# Patient Record
Sex: Female | Born: 1985
Health system: Southern US, Community
[De-identification: ages and names within clinical notes are randomized; demographics above are authoritative.]

## PROBLEM LIST (undated history)

## (undated) ENCOUNTER — Inpatient Hospital Stay (HOSPITAL_COMMUNITY): Payer: Self-pay

## (undated) DIAGNOSIS — G43909 Migraine, unspecified, not intractable, without status migrainosus: Secondary | ICD-10-CM

## (undated) DIAGNOSIS — F329 Major depressive disorder, single episode, unspecified: Secondary | ICD-10-CM

## (undated) DIAGNOSIS — J45909 Unspecified asthma, uncomplicated: Secondary | ICD-10-CM

## (undated) DIAGNOSIS — F1110 Opioid abuse, uncomplicated: Secondary | ICD-10-CM

## (undated) DIAGNOSIS — F319 Bipolar disorder, unspecified: Secondary | ICD-10-CM

## (undated) DIAGNOSIS — B009 Herpesviral infection, unspecified: Secondary | ICD-10-CM

## (undated) DIAGNOSIS — Z302 Encounter for sterilization: Secondary | ICD-10-CM

## (undated) DIAGNOSIS — N12 Tubulo-interstitial nephritis, not specified as acute or chronic: Secondary | ICD-10-CM

## (undated) DIAGNOSIS — F1910 Other psychoactive substance abuse, uncomplicated: Secondary | ICD-10-CM

## (undated) DIAGNOSIS — N179 Acute kidney failure, unspecified: Secondary | ICD-10-CM

## (undated) DIAGNOSIS — F419 Anxiety disorder, unspecified: Secondary | ICD-10-CM

## (undated) DIAGNOSIS — R9431 Abnormal electrocardiogram [ECG] [EKG]: Secondary | ICD-10-CM

## (undated) DIAGNOSIS — F32A Depression, unspecified: Secondary | ICD-10-CM

## (undated) DIAGNOSIS — K219 Gastro-esophageal reflux disease without esophagitis: Secondary | ICD-10-CM

---
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
## 2004-01-09 ENCOUNTER — Emergency Department (HOSPITAL_COMMUNITY): Admission: EM | Admit: 2004-01-09 | Discharge: 2004-01-09 | Payer: Self-pay | Admitting: Emergency Medicine

## 2004-01-13 ENCOUNTER — Emergency Department (HOSPITAL_COMMUNITY): Admission: EM | Admit: 2004-01-13 | Discharge: 2004-01-13 | Payer: Self-pay | Admitting: Family Medicine

## 2004-05-26 ENCOUNTER — Emergency Department (HOSPITAL_COMMUNITY): Admission: EM | Admit: 2004-05-26 | Discharge: 2004-05-26 | Payer: Self-pay | Admitting: *Deleted

## 2005-07-18 ENCOUNTER — Emergency Department (HOSPITAL_COMMUNITY): Admission: EM | Admit: 2005-07-18 | Discharge: 2005-07-18 | Payer: Self-pay | Admitting: Family Medicine

## 2006-07-19 ENCOUNTER — Inpatient Hospital Stay (HOSPITAL_COMMUNITY): Admission: AD | Admit: 2006-07-19 | Discharge: 2006-07-19 | Payer: Self-pay | Admitting: Obstetrics and Gynecology

## 2006-07-20 ENCOUNTER — Inpatient Hospital Stay (HOSPITAL_COMMUNITY): Admission: AD | Admit: 2006-07-20 | Discharge: 2006-07-20 | Payer: Self-pay | Admitting: Obstetrics and Gynecology

## 2006-08-19 ENCOUNTER — Inpatient Hospital Stay (HOSPITAL_COMMUNITY): Admission: AD | Admit: 2006-08-19 | Discharge: 2006-08-30 | Payer: Self-pay | Admitting: Obstetrics and Gynecology

## 2006-08-28 ENCOUNTER — Encounter (INDEPENDENT_AMBULATORY_CARE_PROVIDER_SITE_OTHER): Payer: Self-pay | Admitting: Obstetrics and Gynecology

## 2007-01-04 DIAGNOSIS — B009 Herpesviral infection, unspecified: Secondary | ICD-10-CM

## 2007-01-04 HISTORY — DX: Herpesviral infection, unspecified: B00.9

## 2007-01-14 ENCOUNTER — Emergency Department (HOSPITAL_COMMUNITY): Admission: EM | Admit: 2007-01-14 | Discharge: 2007-01-14 | Payer: Self-pay | Admitting: Family Medicine

## 2007-10-23 ENCOUNTER — Emergency Department (HOSPITAL_COMMUNITY): Admission: EM | Admit: 2007-10-23 | Discharge: 2007-10-23 | Payer: Self-pay | Admitting: Emergency Medicine

## 2009-01-06 ENCOUNTER — Emergency Department (HOSPITAL_COMMUNITY): Admission: EM | Admit: 2009-01-06 | Discharge: 2009-01-06 | Payer: Self-pay | Admitting: Emergency Medicine

## 2009-04-01 ENCOUNTER — Emergency Department (HOSPITAL_COMMUNITY): Admission: EM | Admit: 2009-04-01 | Discharge: 2009-04-01 | Payer: Self-pay | Admitting: Family Medicine

## 2009-04-04 ENCOUNTER — Emergency Department (HOSPITAL_COMMUNITY): Admission: EM | Admit: 2009-04-04 | Discharge: 2009-04-04 | Payer: Self-pay | Admitting: Emergency Medicine

## 2010-05-21 NOTE — Discharge Summary (Signed)
NAME:  Eileen Torres, Eileen Torres NO.:  [id number]   MEDICAL RECORD NO.:  [id number]          PATIENT TYPE:  INP   LOCATION:  9107                          FACILITY:  [facility]   PHYSICIAN:  Huel Cote, M.D. DATE OF BIRTH:  25-Jul-1985   DATE OF ADMISSION:  08/19/2006  DATE OF DISCHARGE:  08/30/2006                               DISCHARGE SUMMARY   DISCHARGE DIAGNOSES:  1. Preterm pregnancy, delivered at 32 plus weeks gestation.  2. Status post preterm premature rupture of membranes at [redacted] weeks      gestation.   DISCHARGE MEDICATIONS:  1. Motrin 600 mg p.o. every 6 hours p.r.n.  2. Percocet 1-2 tablets p.o. every 4 hours p.r.n.   DISCHARGE FOLLOWUP:  The patient will follow up in the office in 6 weeks  for her full postpartum exam   HOSPITAL COURSE:  The patient and a 25 year old G3, P 0-0-2-0, who was  admitted at 13 plus weeks gestation with spontaneous rupture of  membranes noted and confirmed on admission. Her prenatal course had been  complicated by shortening and funneling of her cervix at [redacted] weeks  gestation for which she was started on 17 hydroxyprogesterone and had  received betamethasone.   PAST OBSTETRICAL HISTORY:  Prior to this pregnancy was significant for  two elective abortions.   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 Remote history of asthma.  2. Depression.  3. History of migraines.   She has no significant surgical history.   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 Blood type is  O+. Antibody screen negative, rubella immune, hepatitis B surface  antigen negative, syphilis negative, HIV negative, GC and chlamydia  negative.   On admission, the patient was noted be grossly ruptured.  Cervix was 1  cm dilated just prior to that. She was noted to be vertex by ultrasound  on admission. She received her  betamethasone as stated at 27 weeks, and  when she was admitted she was placed on ampicillin and erythromycin as  well as Valtrex. She then remained in-house under close observation and  had an uneventful course for approximately 2 weeks.   On August 25, the patient awoke with significant contractions and some  bloody discharge noted.  Her cervix had changed to approximately 3 cm  and completely effaced consistent with labor. Therefore, she was moved  to labor and delivery.  Fetal heart rate reactive, and she had no  temperature at that point. There were no signs and symptoms of active  chorio; however, given her preterm status, she was placed on ampicillin,  and her prolonged rupture of membranes.  She progressed on her own to  approximately 5 cm and had a rupture of a forebag at that point. Fetal  heart rate remained reassuring but did increase in baseline. Her white  blood cell count was noted to be 21,000, and she still had no  temperature. She progressed to complete dilation and pushed well  with  vaginal delivery of a vigorous female infant over an intact perineum.  Apgars were 7/8.  Weight of the baby was 4 pounds 5 ounces. There was a  nuchal cord x1 which was delivered through. The placenta delivered  spontaneously.  However, there were some remaining placental fragments  palpated in the uterus fundus and had to be removed manually.  Cervix  and rectum and vagina were intact.  Estimated blood loss was 400 mL.  The baby was taken to the NICU for early gestational age; however,  looked good and was requiring only blow-by nasal cannula O2.   By postpartum day #2, the patient was doing quite well.  She remained on  antibiotics for a short while post delivery and had no temperature  spike. She was felt stable for discharge home on August 30, 2006, and  had social work and other support mechanisms in place for her baby in  the NICU.  Therefore, she was discharged home this a Motrin  prescription  and actually declined Percocet prescription.      Huel Cote, M.D.  Electronically Signed     KR/MEDQ  D:  09/30/2006  T:  09/30/2006  Job:  [id number]

## 2010-09-03 ENCOUNTER — Inpatient Hospital Stay (INDEPENDENT_AMBULATORY_CARE_PROVIDER_SITE_OTHER)
Admission: RE | Admit: 2010-09-03 | Discharge: 2010-09-03 | Disposition: A | Discharge status: Home / Self Care | Payer: Self-pay | Source: Ambulatory Visit | Attending: Family Medicine | Admitting: Family Medicine

## 2010-09-03 DIAGNOSIS — G43009 Migraine without aura, not intractable, without status migrainosus: Secondary | ICD-10-CM

## 2010-10-08 ENCOUNTER — Emergency Department (HOSPITAL_COMMUNITY)
Admission: EM | Admit: 2010-10-08 | Discharge: 2010-10-09 | Discharge status: Against Medical Advice | Payer: Self-pay | Attending: Emergency Medicine | Admitting: Emergency Medicine

## 2010-10-08 DIAGNOSIS — Z0389 Encounter for observation for other suspected diseases and conditions ruled out: Secondary | ICD-10-CM | POA: Insufficient documentation

## 2010-10-09 ENCOUNTER — Emergency Department (HOSPITAL_COMMUNITY)
Admission: EM | Admit: 2010-10-09 | Discharge: 2010-10-09 | Disposition: A | Discharge status: Home / Self Care | Payer: Self-pay | Attending: Emergency Medicine | Admitting: Emergency Medicine

## 2010-10-09 DIAGNOSIS — Z8619 Personal history of other infectious and parasitic diseases: Secondary | ICD-10-CM | POA: Insufficient documentation

## 2010-10-09 DIAGNOSIS — K5289 Other specified noninfective gastroenteritis and colitis: Secondary | ICD-10-CM | POA: Insufficient documentation

## 2010-10-09 LAB — URINALYSIS, ROUTINE W REFLEX MICROSCOPIC
Nitrite: NEGATIVE
Specific Gravity, Urine: 1.028 (ref 1.005–1.030)
Urobilinogen, UA: 0.2 mg/dL (ref 0.0–1.0)

## 2010-10-09 LAB — DIFFERENTIAL
Eosinophils Absolute: 0.1 10*3/uL (ref 0.0–0.7)
Lymphocytes Relative: 20 % (ref 12–46)
Monocytes Absolute: 1.4 10*3/uL — ABNORMAL HIGH (ref 0.1–1.0)
Monocytes Relative: 11 % (ref 3–12)
Neutro Abs: 8.5 10*3/uL — ABNORMAL HIGH (ref 1.7–7.7)

## 2010-10-09 LAB — CBC
HCT: 45.4 % (ref 36.0–46.0)
Hemoglobin: 16 g/dL — ABNORMAL HIGH (ref 12.0–15.0)
Platelets: 342 10*3/uL (ref 150–400)
WBC: 12.4 10*3/uL — ABNORMAL HIGH (ref 4.0–10.5)

## 2010-10-09 LAB — COMPREHENSIVE METABOLIC PANEL
Albumin: 5.1 g/dL (ref 3.5–5.2)
Alkaline Phosphatase: 86 U/L (ref 39–117)
Calcium: 11 mg/dL — ABNORMAL HIGH (ref 8.4–10.5)
Chloride: 94 mEq/L — ABNORMAL LOW (ref 96–112)
Potassium: 2.9 mEq/L — ABNORMAL LOW (ref 3.5–5.1)
Sodium: 138 mEq/L (ref 135–145)
Total Bilirubin: 0.8 mg/dL (ref 0.3–1.2)
Total Protein: 9.1 g/dL — ABNORMAL HIGH (ref 6.0–8.3)

## 2010-10-09 LAB — URINE MICROSCOPIC-ADD ON

## 2010-10-09 LAB — LIPASE, BLOOD: Lipase: 21 U/L (ref 11–59)

## 2010-10-11 LAB — URINE CULTURE: Culture  Setup Time: 201210061820

## 2010-10-15 LAB — CBC
HCT: 28.1 — ABNORMAL LOW
HCT: 32 — ABNORMAL LOW
HCT: 32.5 — ABNORMAL LOW
Hemoglobin: 11.2 — ABNORMAL LOW
MCV: 88.8
MCV: 89.3
Platelets: 221
RBC: 3.59 — ABNORMAL LOW
RDW: 13.4
RDW: 13.8
WBC: 15 — ABNORMAL HIGH
WBC: 21.2 — ABNORMAL HIGH

## 2010-10-15 LAB — STREP B DNA PROBE

## 2010-12-14 ENCOUNTER — Emergency Department (HOSPITAL_COMMUNITY)
Admission: EM | Admit: 2010-12-14 | Discharge: 2010-12-15 | Disposition: A | Discharge status: Home / Self Care | Payer: Medicaid Other | Attending: Emergency Medicine | Admitting: Emergency Medicine

## 2010-12-14 ENCOUNTER — Encounter: Payer: Self-pay | Admitting: Emergency Medicine

## 2010-12-14 DIAGNOSIS — O209 Hemorrhage in early pregnancy, unspecified: Secondary | ICD-10-CM | POA: Insufficient documentation

## 2010-12-14 DIAGNOSIS — O200 Threatened abortion: Secondary | ICD-10-CM

## 2010-12-14 LAB — URINALYSIS, ROUTINE W REFLEX MICROSCOPIC
Bilirubin Urine: NEGATIVE
Glucose, UA: NEGATIVE mg/dL
Ketones, ur: NEGATIVE mg/dL
Leukocytes, UA: NEGATIVE
Nitrite: NEGATIVE
Protein, ur: NEGATIVE mg/dL
Specific Gravity, Urine: 1.008 (ref 1.005–1.030)
Urobilinogen, UA: 0.2 mg/dL (ref 0.0–1.0)
pH: 6 (ref 5.0–8.0)

## 2010-12-14 LAB — URINE MICROSCOPIC-ADD ON

## 2010-12-14 LAB — POCT I-STAT, CHEM 8
Calcium, Ion: 1.19 mmol/L (ref 1.12–1.32)
Chloride: 105 mEq/L (ref 96–112)
HCT: 35 % — ABNORMAL LOW (ref 36.0–46.0)
Hemoglobin: 11.9 g/dL — ABNORMAL LOW (ref 12.0–15.0)
TCO2: 22 mmol/L (ref 0–100)

## 2010-12-14 MED ORDER — KETOROLAC TROMETHAMINE 30 MG/ML IJ SOLN
30.0000 mg | Freq: Once | INTRAMUSCULAR | Status: AC
  Administered 2010-12-14: 30 mg via INTRAVENOUS
  Filled 2010-12-14: qty 1

## 2010-12-14 NOTE — ED Notes (Signed)
MD at bedside. 

## 2010-12-14 NOTE — ED Notes (Signed)
Pt states she is pregnant and started having cramping last night and today she states she started having some bleeding and has passed a large clot earlier this evening  Pt states now is having bright red blood and is having cramping and pain in her pelvic region  Pt states the flow of blood varies in amt

## 2010-12-14 NOTE — ED Notes (Signed)
Pt states she has had some light headedness associated with the pain  Pt states she sees Dr Senaida Ores  Pt states she is to see him when she is [redacted] weeks pregnant but she is only 11  Pt states she had difficulty with her pregnancy of her daughter

## 2010-12-15 ENCOUNTER — Emergency Department (HOSPITAL_COMMUNITY): Payer: Medicaid Other

## 2010-12-15 LAB — GC/CHLAMYDIA PROBE AMP, GENITAL
Chlamydia, DNA Probe: NEGATIVE
GC Probe Amp, Genital: NEGATIVE

## 2010-12-15 LAB — WET PREP, GENITAL
Trich, Wet Prep: NONE SEEN
Yeast Wet Prep HPF POC: NONE SEEN

## 2010-12-15 LAB — ABO/RH: ABO/RH(D): O POS

## 2010-12-15 LAB — HCG, QUANTITATIVE, PREGNANCY: hCG, Beta Chain, Quant, S: 67077 m[IU]/mL — ABNORMAL HIGH (ref <5)

## 2010-12-15 NOTE — ED Notes (Signed)
Patient returned from Ultrasound. 

## 2010-12-15 NOTE — ED Notes (Signed)
Patient transported to Ultrasound 

## 2010-12-15 NOTE — ED Provider Notes (Signed)
History     CSN: [id number] Arrival date & time: 12/14/2010  8:19 PM   First MD Initiated Contact with Patient 12/14/10 2027      Chief Complaint  Patient presents with  . Miscarriage     HPI: [facility]. Has had 3 positive HPT since. Scheduled for first OB visit in approx 1 week. States is approx 11 wks preg. States onset of mild lower abd  cramping yesterday. Started spotting earlier today and by 1800 this evening  was bleeding like a period w/ small clots.    History reviewed. No pertinent past medical history.  History reviewed. No pertinent past surgical history.  Family History  Problem Relation Age of Onset  . Cancer Other   . Diabetes Other   . Heart failure Other   . Stroke Other     History  Substance Use Topics  . Smoking status: Current Everyday Smoker  . Smokeless tobacco: Not on file  . Alcohol Use: No    OB History    Grav Para Term Preterm Abortions TAB SAB Ect Mult Living   2 1              Review of Systems  Constitutional: Negative.   HENT: Negative.   Eyes: Negative.   Respiratory: Negative.   Cardiovascular: Negative.   Gastrointestinal: Negative.   Genitourinary: Negative.   Musculoskeletal: Negative.   Skin: Negative.   Neurological: Negative.   Hematological: Negative.   Psychiatric/Behavioral: Negative.     Allergies  Review of patient's allergies indicates no known allergies.  Home Medications   Current Outpatient Rx  Name Route Sig Dispense Refill  . IBUPROFEN 200 MG PO TABS Oral Take 200 mg by mouth every 6 (six) hours as needed. Pain on pelvic, stomach back areas     . KP PRENATAL MULTIVITAMINS PO Oral Take 1 tablet by mouth daily.        BP 99/50  Pulse 92  Temp(Src) 97.5 F (36.4 C) (Oral)  Resp 18  SpO2 96%  LMP 10/17/2010  Physical Exam  Constitutional: She is oriented to person, place, and time. She appears well-developed and well-nourished.    HENT:  Head: Normocephalic and atraumatic.  Eyes: Conjunctivae are normal.  Neck: Neck supple.  Cardiovascular: Normal rate and regular rhythm.   Pulmonary/Chest: Effort normal and breath sounds normal.  Abdominal: Soft. Bowel sounds are normal.  Genitourinary: Vagina normal and uterus normal. Pelvic exam was performed with patient supine. There is no rash or tenderness on the right labia. There is no rash or tenderness on the left labia. Cervix exhibits no motion tenderness and no friability. Right adnexum displays no mass, no tenderness and no fullness. Left adnexum displays no mass, no tenderness and no fullness.  Musculoskeletal: Normal range of motion.  Neurological: She is alert and oriented to person, place, and time.  Skin: Skin is warm and dry. No erythema.  Psychiatric: She has a normal mood and affect.    ED Course  Pelvic exam Date/Time: 12/15/2010 1:15 AM Performed by: Leanne Chang Authorized by: Leanne Chang Consent: Verbal consent obtained. Risks and benefits: risks, benefits and alternatives were discussed Consent given by: patient Patient understanding: patient states understanding of the procedure being performed Required items: required blood products, implants, devices, and special equipment available Patient identity confirmed: verbally with patient and arm band Local anesthesia used: no Patient sedated: no Comments: See PE notes  Findings at this  point discussed w/ pt. Awaiting u/s. 0115: U/S findings discussed w/ pt. Will plan for d/c home w/ instructions on "bleeding during early preg" and" threatened miscarriage" and encoursge pt to call her OB/GYN MD tomorrow arrange f/u than sooner than current scheduled appointment.   Labs Reviewed  URINALYSIS, ROUTINE W REFLEX MICROSCOPIC - Abnormal; Notable for the following:    Hgb urine dipstick SMALL (*)    All other components within normal limits  POCT I-STAT, CHEM 8 - Abnormal; Notable for the  following:    Potassium 3.4 (*)    Hemoglobin 11.9 (*)    HCT 35.0 (*)    All other components within normal limits  URINE MICROSCOPIC-ADD ON - Abnormal; Notable for the following:    Squamous Epithelial / LPF FEW (*)    Bacteria, UA FEW (*)    All other components within normal limits  HCG, QUANTITATIVE, PREGNANCY - Abnormal; Notable for the following:    hCG, Beta Chain, Quant, [place] [address] (*)    All other components within normal limits  I-STAT, CHEM 8  POCT PREGNANCY, URINE  ABO/RH   No results found.   No diagnosis found.    MDM  Cramping and bleeding in early pregnancy. Normal OB ultrasound.        Leanne Chang, NP 12/19/10 1818

## 2010-12-20 NOTE — ED Provider Notes (Signed)
Medical screening examination/treatment/procedure(s) were performed by non-physician practitioner and as supervising physician I was immediately available for consultation/collaboration.  Rasul Decola, MD 12/20/10 0805 

## 2011-01-04 HISTORY — PX: DILITATION & CURRETTAGE/HYSTROSCOPY WITH ESSURE: SHX5573

## 2011-01-21 LAB — OB RESULTS CONSOLE ANTIBODY SCREEN: Antibody Screen: NEGATIVE

## 2011-03-02 ENCOUNTER — Other Ambulatory Visit: Payer: Self-pay

## 2011-03-02 ENCOUNTER — Emergency Department (HOSPITAL_COMMUNITY)
Admission: EM | Admit: 2011-03-02 | Discharge: 2011-03-02 | Disposition: A | Discharge status: Home / Self Care | Payer: Medicaid Other | Attending: Emergency Medicine | Admitting: Emergency Medicine

## 2011-03-02 ENCOUNTER — Encounter (HOSPITAL_COMMUNITY): Payer: Self-pay | Admitting: Emergency Medicine

## 2011-03-02 DIAGNOSIS — G43909 Migraine, unspecified, not intractable, without status migrainosus: Secondary | ICD-10-CM | POA: Insufficient documentation

## 2011-03-02 DIAGNOSIS — R209 Unspecified disturbances of skin sensation: Secondary | ICD-10-CM | POA: Insufficient documentation

## 2011-03-02 DIAGNOSIS — O210 Mild hyperemesis gravidarum: Secondary | ICD-10-CM | POA: Insufficient documentation

## 2011-03-02 DIAGNOSIS — F17200 Nicotine dependence, unspecified, uncomplicated: Secondary | ICD-10-CM | POA: Insufficient documentation

## 2011-03-02 DIAGNOSIS — O99891 Other specified diseases and conditions complicating pregnancy: Secondary | ICD-10-CM | POA: Insufficient documentation

## 2011-03-02 HISTORY — DX: Migraine, unspecified, not intractable, without status migrainosus: G43.909

## 2011-03-02 LAB — URINALYSIS, DIPSTICK ONLY
Glucose, UA: NEGATIVE mg/dL
Hgb urine dipstick: NEGATIVE
Ketones, ur: NEGATIVE mg/dL
Leukocytes, UA: NEGATIVE
pH: 7 (ref 5.0–8.0)

## 2011-03-02 MED ORDER — SODIUM CHLORIDE 0.9 % IV BOLUS (SEPSIS)
1000.0000 mL | Freq: Once | INTRAVENOUS | Status: AC
  Administered 2011-03-02: 1000 mL via INTRAVENOUS

## 2011-03-02 MED ORDER — FENTANYL CITRATE 0.05 MG/ML IJ SOLN
50.0000 ug | Freq: Once | INTRAMUSCULAR | Status: AC
  Administered 2011-03-02: 50 ug via INTRAVENOUS
  Filled 2011-03-02: qty 2

## 2011-03-02 MED ORDER — PROMETHAZINE HCL 25 MG/ML IJ SOLN
12.5000 mg | Freq: Four times a day (QID) | INTRAMUSCULAR | Status: DC | PRN
  Administered 2011-03-02: 12.5 mg via INTRAVENOUS
  Filled 2011-03-02: qty 1

## 2011-03-02 NOTE — ED Notes (Signed)
Pt presenting to ed with c/o sent by pcp for left side numbness, blurred vision, headache, nausea and vomiting pt states she was across the street at [facility] office. Pt with numbness to her tongue only at this time. Pt states her headache pain is 10/10. Pt with no neuro deficits noted at this time. Pt with equal grips bilaterally pt with no facial droop noted and no slurred speech at this time

## 2011-03-02 NOTE — ED Notes (Signed)
Pt sent to ED by Dr. Ambrose Mantle. Pt is 21.[redacted] weeks pregnant.  Per MD, pt lost her lateral vision in her left eye this am, then developed headache, nausea, left arm and leg numbness. Pt with h/o migraines.  Neuro assessment WDL in office per Dr. Ambrose Mantle.

## 2011-03-02 NOTE — ED Provider Notes (Signed)
History     CSN: [id number]  Arrival date & time 03/02/11  1427   First MD Initiated Contact with Patient 03/02/11 1552      Chief Complaint  Patient presents with  . Numbness  . Nausea  . Emesis    (Consider location/radiation/quality/duration/timing/severity/associated sxs/prior treatment) HPI Pt presenting to ed with c/o sent by pcp for left side numbness, blurred vision, headache, nausea and vomiting pt states she was across the street at md [facility] office. Pt with numbness to her tongue only at this time.. Pt with no neuro deficits noted at this time. Pt with equal grips bilaterally pt with no facial droop noted and no slurred speech at this time.  Patient has history of migraines in the past.  Strong family history of migraines.  No history of cerebral aneurysms.  Patient states the headache is now 7/10.  Past Medical History  Diagnosis Date  . Migraines     History reviewed. No pertinent past surgical history.  Family History  Problem Relation Age of Onset  . Cancer Other   . Diabetes Other   . Heart failure Other   . Stroke Other     History  Substance Use Topics  . Smoking status: Current Everyday Smoker  . Smokeless tobacco: Not on file  . Alcohol Use: No    OB History    Grav Para Term Preterm Abortions TAB SAB Ect Mult Living   2 1              Review of Systems  All other systems reviewed and are negative.    Allergies  Review of patient's allergies indicates no known allergies.  Home Medications   Current Outpatient Rx  Name Route Sig Dispense Refill  . ACETAMINOPHEN 325 MG PO TABS Oral Take 650 mg by mouth every 6 (six) hours as needed. For pain    . CALCIUM CARBONATE ANTACID 500 MG PO CHEW Oral Chew 1 tablet by mouth as needed.    . IBUPROFEN 200 MG PO TABS Oral Take 400 mg by mouth every 6 (six) hours as needed. Pain on pelvic, stomach back areas    . KP PRENATAL MULTIVITAMINS PO Oral Take 1 tablet by mouth daily.        BP 115/61   Pulse 100  Temp(Src) 98.7 F (37.1 C) (Oral)  Resp 20  SpO2 100%  LMP 10/17/2010  Physical Exam  Nursing note and vitals reviewed. Constitutional: She is oriented to person, place, and time. She appears well-developed and well-nourished. No distress.  HENT:  Head: Normocephalic and atraumatic.  Eyes: Pupils are equal, round, and reactive to light.  Neck: Normal range of motion.  Cardiovascular: Normal rate and intact distal pulses.   Pulmonary/Chest: No respiratory distress.  Abdominal: Normal appearance. She exhibits no distension.  Musculoskeletal: Normal range of motion.  Neurological: She is alert and oriented to person, place, and time. No cranial nerve deficit. GCS eye subscore is 4. GCS verbal subscore is 5. GCS motor subscore is 6.  Skin: Skin is warm and dry. No rash noted.  Psychiatric: She has a normal mood and affect. Her behavior is normal.    ED Course  Procedures (including critical care time) Scheduled Meds:    . fentaNYL  50 mcg Intravenous Once  . sodium chloride  1,000 mL Intravenous Once   Continuous Infusions:  PRN Meds:.promethazine  Labs Reviewed  URINALYSIS, DIPSTICK ONLY - Abnormal; Notable for the following:    Specific Gravity, Urine 1.001 (*)  All other components within normal limits   No results found.   1. Migraine   2. IUP (intrauterine pregnancy), incidental       MDM  After treatment in the ED the patient feels back to baseline and wants to go home.         Nelia Shi, MD 03/02/11 1739

## 2011-03-12 ENCOUNTER — Inpatient Hospital Stay (HOSPITAL_COMMUNITY)
Admission: AD | Admit: 2011-03-12 | Discharge: 2011-03-12 | Disposition: A | Discharge status: Home / Self Care | Payer: Medicaid Other | Source: Ambulatory Visit | Attending: Obstetrics and Gynecology | Admitting: Obstetrics and Gynecology

## 2011-03-12 ENCOUNTER — Encounter (HOSPITAL_COMMUNITY): Payer: Self-pay | Admitting: *Deleted

## 2011-03-12 DIAGNOSIS — R112 Nausea with vomiting, unspecified: Secondary | ICD-10-CM

## 2011-03-12 DIAGNOSIS — O212 Late vomiting of pregnancy: Secondary | ICD-10-CM | POA: Insufficient documentation

## 2011-03-12 DIAGNOSIS — R197 Diarrhea, unspecified: Secondary | ICD-10-CM | POA: Insufficient documentation

## 2011-03-12 HISTORY — DX: Major depressive disorder, single episode, unspecified: F32.9

## 2011-03-12 HISTORY — DX: Anxiety disorder, unspecified: F41.9

## 2011-03-12 HISTORY — DX: Depression, unspecified: F32.A

## 2011-03-12 LAB — DIFFERENTIAL
Eosinophils Relative: 1 % (ref 0–5)
Lymphocytes Relative: 5 % — ABNORMAL LOW (ref 12–46)
Monocytes Absolute: 0.8 10*3/uL (ref 0.1–1.0)
Monocytes Relative: 6 % (ref 3–12)
Neutro Abs: 11.3 10*3/uL — ABNORMAL HIGH (ref 1.7–7.7)

## 2011-03-12 LAB — COMPREHENSIVE METABOLIC PANEL
BUN: 22 mg/dL (ref 6–23)
CO2: 20 mEq/L (ref 19–32)
Chloride: 99 mEq/L (ref 96–112)
Creatinine, Ser: 0.74 mg/dL (ref 0.50–1.10)
GFR calc Af Amer: >90 mL/min (ref >90)
GFR calc non Af Amer: >90 mL/min (ref >90)
Glucose, Bld: 110 mg/dL — ABNORMAL HIGH (ref 70–99)
Total Bilirubin: 0.3 mg/dL (ref 0.3–1.2)

## 2011-03-12 LAB — CBC
HCT: 32.4 % — ABNORMAL LOW (ref 36.0–46.0)
Hemoglobin: 10.9 g/dL — ABNORMAL LOW (ref 12.0–15.0)
MCV: 88.5 fL (ref 78.0–100.0)
WBC: 12.8 10*3/uL — ABNORMAL HIGH (ref 4.0–10.5)

## 2011-03-12 LAB — URINALYSIS, ROUTINE W REFLEX MICROSCOPIC
Leukocytes, UA: NEGATIVE
Nitrite: NEGATIVE
Protein, ur: 30 mg/dL — AB
Urobilinogen, UA: 0.2 mg/dL (ref 0.0–1.0)

## 2011-03-12 LAB — URINE MICROSCOPIC-ADD ON

## 2011-03-12 MED ORDER — PROMETHAZINE HCL 25 MG PO TABS: 12.5000 mg | ORAL_TABLET | Freq: Four times a day (QID) | ORAL | Status: DC | PRN

## 2011-03-12 MED ORDER — ONDANSETRON 8 MG PO TBDP: 8.0000 mg | ORAL_TABLET | Freq: Three times a day (TID) | ORAL | Status: AC | PRN

## 2011-03-12 MED ORDER — FAMOTIDINE 20 MG PO TABS
20.0000 mg | ORAL_TABLET | Freq: Once | ORAL | Status: AC
  Administered 2011-03-12: 20 mg via ORAL
  Filled 2011-03-12: qty 1

## 2011-03-12 MED ORDER — THIAMINE HCL 100 MG/ML IJ SOLN
Freq: Once | INTRAVENOUS | Status: AC
  Administered 2011-03-12: 12:00:00 via INTRAVENOUS
  Filled 2011-03-12: qty 1000

## 2011-03-12 MED ORDER — ONDANSETRON HCL 4 MG/2ML IJ SOLN
4.0000 mg | Freq: Once | INTRAMUSCULAR | Status: AC
  Administered 2011-03-12: 4 mg via INTRAVENOUS
  Filled 2011-03-12: qty 2

## 2011-03-12 MED ORDER — SODIUM CHLORIDE 0.9 % IV BOLUS (SEPSIS)
1000.0000 mL | Freq: Once | INTRAVENOUS | Status: AC
  Administered 2011-03-12: 1000 mL via INTRAVENOUS

## 2011-03-12 NOTE — Discharge Instructions (Signed)
FOLLOW UP IN THE OFFICE IN THE NEXT 2 TO 3 DAYS.  B.R.A.T. Diet Your doctor has recommended the B.R.A.T. diet for you or your child until the condition improves. This is often used to help control diarrhea and vomiting symptoms. If you or your child can tolerate clear liquids, you may have:  Bananas.   Rice.   Applesauce.   Toast (and other simple starches such as crackers, potatoes, noodles).  Be sure to avoid dairy products, meats, and fatty foods until symptoms are better. Fruit juices such as apple, grape, and prune juice can make diarrhea worse. Avoid these. Continue this diet for 2 days or as instructed by your caregiver. Document Released: [id number] Document Revised: 12/09/2010 Document Reviewed: 06/08/2006 [facility] Patient Information 2012 [place], [place].Nausea and Vomiting Nausea is a sick feeling that often comes before throwing up (vomiting). Vomiting is a reflex where stomach contents come out of your mouth. Vomiting can cause severe loss of body fluids (dehydration). Children and elderly adults can become dehydrated quickly, especially if they also have diarrhea. Nausea and vomiting are symptoms of a condition or disease. It is important to find the cause of your symptoms. CAUSES   Direct irritation of the stomach lining. This irritation can result from increased acid production (gastroesophageal reflux disease), infection, food poisoning, taking certain medicines (such as nonsteroidal anti-inflammatory drugs), alcohol use, or tobacco use.   Signals from the brain.These signals could be caused by a headache, heat exposure, an inner ear disturbance, increased pressure in the brain from injury, infection, a tumor, or a concussion, pain, emotional stimulus, or metabolic problems.   An obstruction in the gastrointestinal tract (bowel obstruction).   Illnesses such as diabetes, hepatitis, gallbladder problems, appendicitis, kidney problems, cancer, sepsis, atypical symptoms of a  heart attack, or eating disorders.   Medical treatments such as chemotherapy and radiation.   Receiving medicine that makes you sleep (general anesthetic) during surgery.  DIAGNOSIS Your caregiver may ask for tests to be done if the problems do not improve after a few days. Tests may also be done if symptoms are severe or if the reason for the nausea and vomiting is not clear. Tests may include:  Urine tests.   Blood tests.   Stool tests.   Cultures (to look for evidence of infection).   X-rays or other imaging studies.  Test results can help your caregiver make decisions about treatment or the need for additional tests. TREATMENT You need to stay well hydrated. Drink frequently but in small amounts.You may wish to drink water, sports drinks, clear broth, or eat frozen ice pops or gelatin dessert to help stay hydrated.When you eat, eating slowly may help prevent nausea.There are also some antinausea medicines that may help prevent nausea. HOME CARE INSTRUCTIONS   Take all medicine as directed by your caregiver.   If you do not have an appetite, do not force yourself to eat. However, you must continue to drink fluids.   If you have an appetite, eat a normal diet unless your caregiver tells you differently.   Eat a variety of complex carbohydrates (rice, wheat, potatoes, bread), lean meats, yogurt, fruits, and vegetables.   Avoid high-fat foods because they are more difficult to digest.   Drink enough water and fluids to keep your urine clear or pale yellow.   If you are dehydrated, ask your caregiver for specific rehydration instructions. Signs of dehydration may include:   Severe thirst.   Dry lips and mouth.   Dizziness.  Dark urine.   Decreasing urine frequency and amount.   Confusion.   Rapid breathing or pulse.  SEEK IMMEDIATE MEDICAL CARE IF:   You have blood or brown flecks (like coffee grounds) in your vomit.   You have black or bloody stools.   You  have a severe headache or stiff neck.   You are confused.   You have severe abdominal pain.   You have chest pain or trouble breathing.   You do not urinate at least once every 8 hours.   You develop cold or clammy skin.   You continue to vomit for longer than 24 to 48 hours.   You have a fever.  MAKE SURE YOU:   Understand these instructions.   Will watch your condition.   Will get help right away if you are not doing well or get worse.  Document Released: 12020/04/2904 Document Revised: 12/09/2010 Document Reviewed: 05/19/2010 [facility] Patient Information 2012 [place], [place].Diet for Diarrhea, Adult Having frequent, runny stools (diarrhea) has many causes. Diarrhea may be caused or worsened by food or drink. Diarrhea may be relieved by changing your diet. IF YOU ARE NOT TOLERATING SOLID FOODS:  Drink enough water and fluids to keep your urine clear or pale yellow.   Avoid sugary drinks and sodas as well as milk-based beverages.   Avoid beverages containing caffeine and alcohol.   You may try rehydrating beverages. You can make your own by following this recipe:    tsp table salt.    tsp baking soda.   ? tsp salt substitute (potassium chloride).   1 tbs + 1 tsp sugar.   1 qt water.  As your stools become more solid, you can start eating solid foods. Add foods one at a time. If a certain food causes your diarrhea to get worse, avoid that food and try other foods. A low fiber, low-fat, and lactose-free diet is recommended. Small, frequent meals may be better tolerated.  Starches  Allowed:  White, [place], and pita breads, plain rolls, buns, bagels. Plain muffins, matzo. Soda, saltine, or graham crackers. Pretzels, melba toast, zwieback. Cooked cereals made with water: cornmeal, farina, cream cereals. Dry cereals: refined corn, wheat, rice. Potatoes prepared any way without skins, refined macaroni, spaghetti, noodles, refined rice.   Avoid:  Bread, rolls, or crackers  made with whole wheat, multi-grains, rye, bran seeds, nuts, or coconut. Corn tortillas or taco shells. Cereals containing whole grains, multi-grains, bran, coconut, nuts, or raisins. Cooked or dry oatmeal. Coarse wheat cereals, granola. Cereals advertised as "high-fiber." Potato skins. Whole grain pasta, wild or brown rice. Popcorn. Sweet potatoes/yams. Sweet rolls, doughnuts, waffles, pancakes, sweet breads.  Vegetables  Allowed: Strained tomato and vegetable juices. Most well-cooked and canned vegetables without seeds. Fresh: Tender lettuce, cucumber without the skin, cabbage, spinach, bean sprouts.   Avoid: Fresh, cooked, or canned: Artichokes, baked beans, beet greens, broccoli, Brussels sprouts, corn, kale, legumes, peas, sweet potatoes. Cooked: Green or red cabbage, spinach. Avoid large servings of any vegetables, because vegetables shrink when cooked, and they contain more fiber per serving than fresh vegetables.  Fruit  Allowed: All fruit juices except prune juice. Cooked or canned: Apricots, applesauce, cantaloupe, cherries, fruit cocktail, grapefruit, grapes, kiwi, mandarin oranges, peaches, pears, plums, watermelon. Fresh: Apples without skin, ripe banana, grapes, cantaloupe, cherries, grapefruit, peaches, oranges, plums. Keep servings limited to  cup or 1 piece.   Avoid: Fresh: Apple with skin, apricots, mango, pears, raspberries, strawberries. Prune juice, stewed or dried prunes. Dried fruits, raisins,  dates. Large servings of all fresh fruits.  Meat and Meat Substitutes  Allowed: Ground or well-cooked tender beef, ham, veal, lamb, pork, or poultry. Eggs, plain cheese. Fish, oysters, shrimp, lobster, other seafoods. Liver, organ meats.   Avoid: Tough, fibrous meats with gristle. Peanut butter, smooth or chunky. Cheese, nuts, seeds, legumes, dried peas, beans, lentils.  Milk  Allowed: Yogurt, lactose-free milk, kefir, drinkable yogurt, buttermilk, soy milk.   Avoid: Milk, chocolate  milk, beverages made with milk, such as milk shakes.  Soups  Allowed: Bouillon, broth, or soups made from allowed foods. Any strained soup.   Avoid: Soups made from vegetables that are not allowed, cream or milk-based soups.  Desserts and Sweets  Allowed: Sugar-free gelatin, sugar-free frozen ice pops made without sugar alcohol.   Avoid: Plain cakes and cookies, pie made with allowed fruit, pudding, custard, cream pie. Gelatin, fruit, ice, sherbet, frozen ice pops. Ice cream, ice milk without nuts. Plain hard candy, honey, jelly, molasses, syrup, sugar, chocolate syrup, gumdrops, marshmallows.  Fats and Oils  Allowed: Avoid any fats and oils.   Avoid: Seeds, nuts, olives, avocados. Margarine, butter, cream, mayonnaise, salad oils, plain salad dressings made from allowed foods. Plain gravy, crisp bacon without rind.  Beverages  Allowed: Water, decaffeinated teas, oral rehydration solutions, sugar-free beverages.   Avoid: Fruit juices, caffeinated beverages (coffee, tea, soda or pop), alcohol, sports drinks, or lemon-lime soda or pop.  Condiments  Allowed: Ketchup, mustard, horseradish, vinegar, cream sauce, cheese sauce, cocoa powder. Spices in moderation: allspice, basil, bay leaves, celery powder or leaves, cinnamon, cumin powder, curry powder, ginger, mace, marjoram, onion or garlic powder, oregano, paprika, parsley flakes, ground pepper, rosemary, sage, savory, tarragon, thyme, turmeric.   Avoid: Coconut, honey.  Weight Monitoring: Weigh yourself every day. You should weigh yourself in the morning after you urinate and before you eat breakfast. Wear the same amount of clothing when you weigh yourself. Record your weight daily. Bring your recorded weights to your clinic visits. Tell your caregiver right away if you have gained 3 lb/1.4 kg or more in 1 day, 5 lb/2.3 kg in a week, or whatever amount you were told to report. SEEK IMMEDIATE MEDICAL CARE IF:   You are unable to keep fluids  down.   You start to throw up (vomit) or diarrhea keeps coming back (persistent).   Abdominal pain develops, increases, or can be felt in one place (localizes).   You have an oral temperature above 102 F (38.9 C), not controlled by medicine.   Diarrhea contains blood or mucus.   You develop excessive weakness, dizziness, fainting, or extreme thirst.  MAKE SURE YOU:   Understand these instructions.   Will watch your condition.   Will get help right away if you are not doing well or get worse.  Document Released: 03/12/2003 Document Revised: 12/09/2010 Document Reviewed: 07/03/2008 [facility] Patient Information 2012 [place], [place].

## 2011-03-12 NOTE — Progress Notes (Signed)
Pt reports having back cramping and n/v/d since yesterday afternoon. Daughter has been sick with it x 4 days.

## 2011-03-12 NOTE — ED Provider Notes (Signed)
History     CSN: [id number]  Arrival date & time 03/12/11  4098   None     Chief Complaint  Patient presents with  . Emesis  . Diarrhea    HPI Eileen Torres is a 26 y.o. female @ [redacted]w[redacted]d gestation who presents to MAU for nausea, vomiting and diarrhea that started yesterday about 3 pm. Her daughter has Noro Virus and she thinks she has the same but was concerned because of the number of times she has vomited and feels dehydrated. Having watery diarrhea every hour and has vomited > 12 times. History of preterm labor with last pregnancy and having back pain that feels similar to that. Patient was evaluated at [facility] last week for loss of vision and numbness on left side. Sent to neurology to r/o stroke. Dx with migraine headache.   Dr. Ellyn Hack called prior to patient arrival here today. The history was provided by the patient and Dr. Ellyn Hack.  Past Medical History  Diagnosis Date  . Migraines   . Depression   . Anxiety     Past Surgical History  Procedure Date  . No past surgeries     Family History  Problem Relation Age of Onset  . Cancer Other   . Diabetes Other   . Heart failure Other   . Stroke Other     History  Substance Use Topics  . Smoking status: Current Everyday Smoker  . Smokeless tobacco: Not on file  . Alcohol Use: No    OB History    Grav Para Term Preterm Abortions TAB SAB Ect Mult Living   2 1  1      1       Review of Systems  Constitutional: Positive for fever, chills and fatigue. Negative for diaphoresis.  HENT: Negative for ear pain, congestion, sore throat, facial swelling, neck pain, neck stiffness, dental problem and sinus pressure.   Eyes: Negative for photophobia, pain and discharge.  Respiratory: Negative for cough, chest tightness and wheezing.   Cardiovascular: Negative.   Gastrointestinal: Positive for nausea, vomiting, abdominal pain and diarrhea. Negative for constipation and abdominal distention.  Genitourinary: Positive for pelvic  pain. Negative for dysuria, frequency, flank pain and difficulty urinating.  Musculoskeletal: Positive for back pain. Negative for myalgias and gait problem.  Skin: Negative for color change and rash.  Neurological: Positive for light-headedness and headaches. Negative for speech difficulty, weakness and numbness.  Psychiatric/Behavioral: Negative for confusion and agitation. The patient is not nervous/anxious.     Allergies  Review of patient's allergies indicates no known allergies.  Home Medications  No current outpatient prescriptions on file.  BP 114/62  Pulse 112  Temp 96.7 F (35.9 C)  Resp 18  Ht 5\' 3"  (1.6 m)  Wt 123 lb (55.792 kg)  BMI 21.79 kg/m2  LMP 10/17/2010  Physical Exam  Nursing note and vitals reviewed. Constitutional: She is oriented to person, place, and time. She appears well-developed and well-nourished. No distress.  HENT:  Head: Normocephalic.  Eyes: EOM are normal.  Neck: Neck supple.  Cardiovascular:       tachycardia  Pulmonary/Chest: Effort normal.  Abdominal: Soft. There is no tenderness.  Musculoskeletal: Normal range of motion.  Neurological: She is alert and oriented to person, place, and time. No cranial nerve deficit.  Skin: There is pallor.  Psychiatric: She has a normal mood and affect. Her behavior is normal. Judgment and thought content normal.   EFM: Baseline FH 155, reassuring, no contractions noted.  Results for orders placed during the hospital encounter of 03/12/11 (from the past 24 hour(s))  CBC     Status: Abnormal   Collection Time   03/12/11 10:27 AM      Component Value Range   WBC 12.8 (*) 4.0 - 10.5 (K/uL)   RBC 3.66 (*) 3.87 - 5.11 (MIL/uL)   Hemoglobin 10.9 (*) 12.0 - 15.0 (g/dL)   HCT 09.6 (*) 04.5 - 46.0 (%)   MCV 88.5  78.0 - 100.0 (fL)   MCH 29.8  26.0 - 34.0 (pg)   MCHC 33.6  30.0 - 36.0 (g/dL)   RDW 40.9  81.1 - 91.4 (%)   Platelets 211  150 - 400 (K/uL)  DIFFERENTIAL     Status: Abnormal   Collection  Time   03/12/11 10:27 AM      Component Value Range   Neutrophils Relative 88 (*) 43 - 77 (%)   Neutro Abs 11.3 (*) 1.7 - 7.7 (K/uL)   Lymphocytes Relative 5 (*) 12 - 46 (%)   Lymphs Abs 0.7  0.7 - 4.0 (K/uL)   Monocytes Relative 6  3 - 12 (%)   Monocytes Absolute 0.8  0.1 - 1.0 (K/uL)   Eosinophils Relative 1  0 - 5 (%)   Eosinophils Absolute 0.1  0.0 - 0.7 (K/uL)   Basophils Relative 0  0 - 1 (%)   Basophils Absolute 0.0  0.0 - 0.1 (K/uL)  COMPREHENSIVE METABOLIC PANEL     Status: Abnormal   Collection Time   03/12/11 10:27 AM      Component Value Range   Sodium 131 (*) 135 - 145 (mEq/L)   Potassium 3.5  3.5 - 5.1 (mEq/L)   Chloride 99  96 - 112 (mEq/L)   CO2 20  19 - 32 (mEq/L)   Glucose, Bld 110 (*) 70 - 99 (mg/dL)   BUN 22  6 - 23 (mg/dL)   Creatinine, Ser 7.82  0.50 - 1.10 (mg/dL)   Calcium 7.8 (*) 8.4 - 10.5 (mg/dL)   Total Protein 6.2  6.0 - 8.3 (g/dL)   Albumin 3.0 (*) 3.5 - 5.2 (g/dL)   AST 16  0 - 37 (U/L)   ALT 12  0 - 35 (U/L)   Alkaline Phosphatase 58  39 - 117 (U/L)   Total Bilirubin 0.3  0.3 - 1.2 (mg/dL)   GFR calc non Af Amer >90  >90 (mL/min)   GFR calc Af Amer >90  >90 (mL/min)  URINALYSIS, ROUTINE W REFLEX MICROSCOPIC     Status: Abnormal   Collection Time   03/12/11 12:19 PM      Component Value Range   Color, Urine YELLOW  YELLOW    APPearance HAZY (*) CLEAR    Specific Gravity, Urine >1.030 (*) 1.005 - 1.030    pH 6.0  5.0 - 8.0    Glucose, UA NEGATIVE  NEGATIVE (mg/dL)   Hgb urine dipstick SMALL (*) NEGATIVE    Bilirubin Urine SMALL (*) NEGATIVE    Ketones, ur 15 (*) NEGATIVE (mg/dL)   Protein, ur 30 (*) NEGATIVE (mg/dL)   Urobilinogen, UA 0.2  0.0 - 1.0 (mg/dL)   Nitrite NEGATIVE  NEGATIVE    Leukocytes, UA NEGATIVE  NEGATIVE   URINE MICROSCOPIC-ADD ON     Status: Abnormal   Collection Time   03/12/11 12:19 PM      Component Value Range   Squamous Epithelial / LPF FEW (*) RARE    WBC, UA 0-2  <3 (  WBC/hpf)   RBC / HPF 3-6  <3 (RBC/hpf)    Bacteria, UA FEW (*) RARE    Urine-Other MUCOUS PRESENT     ED Course: 12:55 pm Discussed with Dr. Ellyn Hack. Will continue IV hydration and after second bag if taking po fluids will d/c home to follow up in the office in 2 days.   Procedures  Assessment: Nausea, vomiting and diarrhea in second trimester pregnancy  Plan:  IV hydration   Zofran IV   Rx Zofran   MDM: patient feeling much better @ 12:40 pm. Second bag of fluids infusing.   13:12 pm Patient states she is ready to go home. Will d/c home with instuctions.        Janne Napoleon, [place] 03/12/11 1312

## 2011-05-31 ENCOUNTER — Inpatient Hospital Stay (HOSPITAL_COMMUNITY)
Admission: AD | Admit: 2011-05-31 | Discharge: 2011-05-31 | Disposition: A | Discharge status: Home / Self Care | Payer: Medicaid Other | Source: Ambulatory Visit | Attending: Obstetrics and Gynecology | Admitting: Obstetrics and Gynecology

## 2011-05-31 ENCOUNTER — Encounter (HOSPITAL_COMMUNITY): Payer: Self-pay | Admitting: *Deleted

## 2011-05-31 DIAGNOSIS — W1800XA Striking against unspecified object with subsequent fall, initial encounter: Secondary | ICD-10-CM

## 2011-05-31 DIAGNOSIS — W1809XA Striking against other object with subsequent fall, initial encounter: Secondary | ICD-10-CM | POA: Insufficient documentation

## 2011-05-31 DIAGNOSIS — R1084 Generalized abdominal pain: Secondary | ICD-10-CM

## 2011-05-31 DIAGNOSIS — O99891 Other specified diseases and conditions complicating pregnancy: Secondary | ICD-10-CM | POA: Insufficient documentation

## 2011-05-31 DIAGNOSIS — R109 Unspecified abdominal pain: Secondary | ICD-10-CM | POA: Insufficient documentation

## 2011-05-31 DIAGNOSIS — Z3480 Encounter for supervision of other normal pregnancy, unspecified trimester: Secondary | ICD-10-CM

## 2011-05-31 DIAGNOSIS — Z3490 Encounter for supervision of normal pregnancy, unspecified, unspecified trimester: Secondary | ICD-10-CM

## 2011-05-31 NOTE — Discharge Instructions (Signed)
Keep your appointments in the office. Call your doctor if you have increasing abdominal pain or vaginal bleeding.

## 2011-05-31 NOTE — MAU Provider Note (Addendum)
  History     CSN: [id number]  Arrival date and time: 05/31/11 1715   None     Chief Complaint  Patient presents with  . Non-stress Test    Prolonged monitoring   HPI This is a 26 year old G2P0101 at 34.1 weeks who presents to the MAU after hitting her gravid abdomen on a crib rail.  She was standing on a stool, leaning over the crib railing when she slipped and hit her abdomen hard on the rail.  She had immediate pain that went away after 10 minutes.  She denies contractions, leaking fluid, vaginal bleeding, vaginal discharge.  She has active fetal movements.    OB History    Grav Para Term Preterm Abortions TAB SAB Ect Mult Living   2 1  1      1       Past Medical History  Diagnosis Date  . Migraines   . Depression   . Anxiety     Past Surgical History  Procedure Date  . No past surgeries     Family History  Problem Relation Age of Onset  . Cancer Other   . Diabetes Other   . Heart failure Other   . Stroke Other     History  Substance Use Topics  . Smoking status: Current Everyday Smoker  . Smokeless tobacco: Not on file  . Alcohol Use: No    Allergies: No Known Allergies  Prescriptions prior to admission  Medication Sig Dispense Refill  . calcium carbonate (TUMS - DOSED IN MG ELEMENTAL CALCIUM) 500 MG chewable tablet Chew 1 tablet by mouth as needed. For heartburn      . diphenhydrAMINE (BENADRYL) 25 MG tablet Take 25 mg by mouth at bedtime as needed. For sleep      . HYDROcodone-acetaminophen (VICODIN) 5-500 MG per tablet Take 1 tablet by mouth every 6 (six) hours as needed. For migraines      . Prenatal Vit-Fe Fumarate-FA (KP PRENATAL MULTIVITAMINS PO) Take 1 tablet by mouth at bedtime.         Review of Systems  All other systems reviewed and are negative.   Physical Exam   Blood pressure 111/61, pulse 105, temperature 97.7 F (36.5 C), temperature source Oral, resp. rate 18, height 5\' 3"  (1.6 m), weight 65.318 kg (144 lb), last menstrual period  10/17/2010.  Physical Exam  Constitutional: She is oriented to person, place, and time. She appears well-developed and well-nourished.  HENT:  Head: Normocephalic and atraumatic.  GI: Soft. She exhibits no distension and no mass. There is no tenderness. There is no rebound and no guarding.  Neurological: She is alert and oriented to person, place, and time.  Skin: Skin is warm and dry.  Psychiatric: She has a normal mood and affect. Her behavior is normal. Judgment and thought content normal.    MAU Course  Procedures NST: Category 1 tracing with baseline rate of 130s.    MDM NST - Baseline 145 - reactive with 15x15 accels noted Consult with Dr. Ellyn Hack - will send home.  Currently no pain or bleeding.  Assessment and Plan  2100  Patient signed out to Nolene Bernheim, NP  Assessment Fall in pregnancy  Plan Monitored for 4 hours. Return with increasing abdominal pain or vaginal bleeding. Keep your appointments as scheduled in the office.   STINSON, JACOB JEHIEL 05/31/2011, 7:42 PM

## 2011-05-31 NOTE — MAU Note (Signed)
Pt in after hitting stomach around 1430.  States she was fixing the nursery and the stool fell out from underneath and she hit the crib in lower abdomen.  Denies any bleeding or discharge.  Reports small amount of pelvic "pulling" but states this is not new since hitting stomach.  + FM.

## 2011-06-25 ENCOUNTER — Encounter (HOSPITAL_COMMUNITY): Payer: Self-pay | Admitting: Obstetrics and Gynecology

## 2011-06-25 ENCOUNTER — Inpatient Hospital Stay (HOSPITAL_COMMUNITY)
Admission: AD | Admit: 2011-06-25 | Discharge: 2011-06-25 | Disposition: A | Discharge status: Home / Self Care | Payer: Medicaid Other | Source: Ambulatory Visit | Attending: Obstetrics and Gynecology | Admitting: Obstetrics and Gynecology

## 2011-06-25 DIAGNOSIS — O99891 Other specified diseases and conditions complicating pregnancy: Secondary | ICD-10-CM | POA: Insufficient documentation

## 2011-06-25 DIAGNOSIS — O368190 Decreased fetal movements, unspecified trimester, not applicable or unspecified: Secondary | ICD-10-CM | POA: Insufficient documentation

## 2011-06-25 NOTE — MAU Note (Signed)
"  I've not felt good all day.  I  Haven't had much to eat today.  I had to force myself to eat a little pizza and I love pizza.  The baby is usually very active and I just haven't felt him move today.  My stomach just keeps balling up and he has been over here (pt points to her RT side) all day long.  No bleeding or leaking of fluid.  I went to the doctor yesterday and everything was fine. I was dilated 2cm/50%.  No leaking or bleeding."

## 2011-06-26 ENCOUNTER — Encounter (HOSPITAL_COMMUNITY): Payer: Self-pay | Admitting: Anesthesiology

## 2011-06-26 ENCOUNTER — Inpatient Hospital Stay (HOSPITAL_COMMUNITY): Payer: Medicaid Other | Admitting: Anesthesiology

## 2011-06-26 ENCOUNTER — Inpatient Hospital Stay (HOSPITAL_COMMUNITY)
Admission: AD | Admit: 2011-06-26 | Discharge: 2011-06-29 | DRG: 775 | Disposition: A | Discharge status: Home / Self Care | Payer: Medicaid Other | Source: Ambulatory Visit | Attending: Obstetrics and Gynecology | Admitting: Obstetrics and Gynecology

## 2011-06-26 ENCOUNTER — Encounter (HOSPITAL_COMMUNITY): Payer: Self-pay

## 2011-06-26 HISTORY — DX: Herpesviral infection, unspecified: B00.9

## 2011-06-26 LAB — CBC
MCH: 27.2 pg (ref 26.0–34.0)
MCV: 82.4 fL (ref 78.0–100.0)
Platelets: 216 10*3/uL (ref 150–400)
RDW: 15.1 % (ref 11.5–15.5)
WBC: 24.4 10*3/uL — ABNORMAL HIGH (ref 4.0–10.5)

## 2011-06-26 MED ORDER — OXYCODONE-ACETAMINOPHEN 5-325 MG PO TABS
1.0000 | ORAL_TABLET | ORAL | Status: DC | PRN
  Administered 2011-06-27: 1 via ORAL
  Filled 2011-06-26: qty 1

## 2011-06-26 MED ORDER — ONDANSETRON HCL 4 MG/2ML IJ SOLN: 4.0000 mg | Freq: Four times a day (QID) | INTRAMUSCULAR | Status: DC | PRN

## 2011-06-26 MED ORDER — OXYTOCIN 40 UNITS IN LACTATED RINGERS INFUSION - SIMPLE MED
62.5000 mL/h | Freq: Once | INTRAVENOUS | Status: AC
  Administered 2011-06-27: 2.5 [IU]/h via INTRAVENOUS

## 2011-06-26 MED ORDER — EPHEDRINE 5 MG/ML INJ: 10.0000 mg | INTRAVENOUS | Status: DC | PRN

## 2011-06-26 MED ORDER — LACTATED RINGERS IV SOLN: 500.0000 mL | Freq: Once | INTRAVENOUS | Status: DC

## 2011-06-26 MED ORDER — EPHEDRINE 5 MG/ML INJ
10.0000 mg | INTRAVENOUS | Status: DC | PRN
  Filled 2011-06-26: qty 4

## 2011-06-26 MED ORDER — ACETAMINOPHEN 325 MG PO TABS: 650.0000 mg | ORAL_TABLET | ORAL | Status: DC | PRN

## 2011-06-26 MED ORDER — FAMOTIDINE 20 MG PO TABS
20.0000 mg | ORAL_TABLET | Freq: Once | ORAL | Status: AC
  Administered 2011-06-26: 20 mg via ORAL
  Filled 2011-06-26: qty 1

## 2011-06-26 MED ORDER — SODIUM BICARBONATE 8.4 % IV SOLN
INTRAVENOUS | Status: DC | PRN
  Administered 2011-06-26: 4 mL via EPIDURAL

## 2011-06-26 MED ORDER — PHENYLEPHRINE 40 MCG/ML (10ML) SYRINGE FOR IV PUSH (FOR BLOOD PRESSURE SUPPORT): 80.0000 ug | PREFILLED_SYRINGE | INTRAVENOUS | Status: DC | PRN

## 2011-06-26 MED ORDER — FENTANYL 2.5 MCG/ML BUPIVACAINE 1/10 % EPIDURAL INFUSION (WH - ANES)
14.0000 mL/h | INTRAMUSCULAR | Status: DC
  Administered 2011-06-26: 14 mL/h via EPIDURAL
  Filled 2011-06-26 (×2): qty 60

## 2011-06-26 MED ORDER — DIPHENHYDRAMINE HCL 50 MG/ML IJ SOLN: 12.5000 mg | INTRAMUSCULAR | Status: DC | PRN

## 2011-06-26 MED ORDER — OXYTOCIN 40 UNITS IN LACTATED RINGERS INFUSION - SIMPLE MED
1.0000 m[IU]/min | INTRAVENOUS | Status: DC
  Administered 2011-06-26: 1 m[IU]/min via INTRAVENOUS
  Administered 2011-06-27: 666 m[IU]/min via INTRAVENOUS
  Filled 2011-06-26: qty 1000

## 2011-06-26 MED ORDER — TERBUTALINE SULFATE 1 MG/ML IJ SOLN: 0.2500 mg | Freq: Once | INTRAMUSCULAR | Status: AC | PRN

## 2011-06-26 MED ORDER — IBUPROFEN 600 MG PO TABS
600.0000 mg | ORAL_TABLET | Freq: Four times a day (QID) | ORAL | Status: DC | PRN
  Administered 2011-06-27: 600 mg via ORAL
  Filled 2011-06-26: qty 1

## 2011-06-26 MED ORDER — LIDOCAINE HCL (PF) 1 % IJ SOLN
30.0000 mL | INTRAMUSCULAR | Status: DC | PRN
  Administered 2011-06-27: 30 mL via SUBCUTANEOUS
  Filled 2011-06-26: qty 30

## 2011-06-26 MED ORDER — OXYTOCIN BOLUS FROM INFUSION
250.0000 mL | Freq: Once | INTRAVENOUS | Status: DC
  Filled 2011-06-26: qty 500

## 2011-06-26 MED ORDER — FLEET ENEMA 7-19 GM/118ML RE ENEM: 1.0000 | ENEMA | RECTAL | Status: DC | PRN

## 2011-06-26 MED ORDER — LACTATED RINGERS IV SOLN
INTRAVENOUS | Status: DC
  Administered 2011-06-26: 125 mL/h via INTRAVENOUS

## 2011-06-26 MED ORDER — FENTANYL 2.5 MCG/ML BUPIVACAINE 1/10 % EPIDURAL INFUSION (WH - ANES)
INTRAMUSCULAR | Status: DC | PRN
  Administered 2011-06-26: 13 mL/h via EPIDURAL

## 2011-06-26 MED ORDER — CITRIC ACID-SODIUM CITRATE 334-500 MG/5ML PO SOLN: 30.0000 mL | ORAL | Status: DC | PRN

## 2011-06-26 MED ORDER — PHENYLEPHRINE 40 MCG/ML (10ML) SYRINGE FOR IV PUSH (FOR BLOOD PRESSURE SUPPORT)
80.0000 ug | PREFILLED_SYRINGE | INTRAVENOUS | Status: DC | PRN
  Filled 2011-06-26: qty 5

## 2011-06-26 MED ORDER — LACTATED RINGERS IV SOLN: 500.0000 mL | INTRAVENOUS | Status: DC | PRN

## 2011-06-26 NOTE — MAU Note (Signed)
Pt states awoke this am feeling better than yesterday when she was here in MAU, around 1130 started having sharp abdominal pain, feels strong tightening now. Rates pain 9/10 with u/c's. Denies bleeding, no gush of fluid.

## 2011-06-26 NOTE — Progress Notes (Signed)
Dr. Ambrose Mantle on the phone and notified of pt status, FHR, decelerations, RN interventions, and SVE. MD in house RN to call for delivery. Will continue to monitor.

## 2011-06-26 NOTE — Progress Notes (Signed)
Dr. Ambrose Mantle on the phone and notified of pt status, SVE, pitocin level, and UC pattern. Will continue to monitor.

## 2011-06-26 NOTE — Anesthesia Procedure Notes (Signed)
Epidural Patient location during procedure: OB  Preanesthetic Checklist Completed: patient identified, site marked, surgical consent, pre-op evaluation, timeout performed, IV checked, risks and benefits discussed and monitors and equipment checked  Epidural Patient position: sitting Prep: site prepped and draped and DuraPrep Patient monitoring: continuous pulse ox and blood pressure Approach: midline Injection technique: LOR air  Needle:  Needle type: Tuohy  Needle gauge: 17 G Needle length: 9 cm Needle insertion depth: 4 cm Catheter type: closed end flexible Catheter size: 19 Gauge Catheter at skin depth: 9 cm Test dose: negative  Assessment Events: blood not aspirated, injection not painful, no injection resistance, negative IV test and no paresthesia  Additional Notes Dosing of Epidural:  1st dose, through needle ............................................. epi 1:200K + Xylocaine 40 mg  2nd dose, through catheter, after waiting 3 minutes.....epi 1:200K + Xylocaine 40 mg  3rd dose, through catheter after waiting 3 minutes .............................Marcaine   4mg   ( mg Marcaine are expressed as equivilent  cc's medication removed from the 0.1%Bupiv / fentanyl syringe from L&D pump)  ( 2% Xylo charted as a single dose in Epic Meds for ease of charting; actual dosing was fractionated as above, for saftey's sake)  As each dose occurred, patient was free of IV sx; and patient exhibited no evidence of SA injection.  Patient is more comfortable after epidural dosed. Please see RN's note for documentation of vital signs,and FHR which are stable.  Patient reminded not to try to ambulate with numb legs, and that an RN must be present the 1st time she attempts to get up.    

## 2011-06-26 NOTE — Anesthesia Preprocedure Evaluation (Signed)

## 2011-06-26 NOTE — Progress Notes (Signed)
Dr. Ambrose Mantle at the bedside and notified of pt status, pain level after epidural placement, UC pattern, and SVE. Will continue to monitor.

## 2011-06-26 NOTE — MAU Note (Signed)
Dr. Ambrose Mantle notified pt in MAU for labor evaluation. Contractions q3-4 minutes apart, variable deceleration noted after first ctx seen on monitor. EFM tracing reactive. Cervix 3.5/90/-2 vertex, bulging membranes. Orders to admit, gbs negative.

## 2011-06-26 NOTE — H&P (Signed)
NAMEMarland Kitchen  MEKLIT, COTTA NO.:  [id number]  MEDICAL RECORD NO.:  [id number]  LOCATION:  9163                          FACILITY:  [facility]  PHYSICIAN:  Malachi Pro. Ambrose Mantle, M.D. DATE OF BIRTH:  July 25, 1985  DATE OF ADMISSION:  06/26/2011 DATE OF DISCHARGE:                             HISTORY & PHYSICAL   PRESENT ILLNESS:  This is a 26 year old white female, para 0-1-2-1, gravida 4, EDC July 10, 2011, admitted with contractions and progression of the cervix.  Blood group and type O positive.  Negative antibody. Pap smear normal.  Rubella immune.  RPR nonreactive.  Urine culture negative.  Hepatitis B surface antigen negative, HIV negative, GC and Chlamydia negative.  She was too advanced for 1st trimester screening. The quad screen was normal.  1-hour Glucola was 106.  Group B strep was negative.  The patient had an ultrasound on December 15, 2010, showed 10 weeks and 3 days.  Her last period was November 17, 2010, so her due date was based on her ultrasound.  A repeat ultrasound on February 14, 2011, showed an average gestational age of [redacted] weeks and 4 days with an [facility] of July 07, 2011 and a final ultrasound on March 14, 2011 showed average gestational age that was not calculated just to complete the anatomical survey and cervical funneling was seen.  The patient had a premature delivery with her 1st delivery at 32 weeks and was treated with Delalutin, starting at 16 weeks during this pregnancy.  She had a prenatal course that was complicated by migraines.  She did receive Delalutin from 16 weeks to 36 weeks.  At her last prenatal exam, fundal height was consistent with dates.  Cervix 2 cm, 50% effaced.  She started Valtrex at 35 weeks 1 g daily.  When she was seen in the maternity admission unit today after contracting regularly since 2:30 p.m., her cervix was 3.5 to 4 cm, 80-90%.  She was admitted.  ALLERGIES:  Reveals no known allergies.  PAST MEDICAL HISTORY:  She has had  a history of asthma, Chlamydia, depression, hepatitis C, herpes simplex virus, migraines, and vaginal trichomoniasis.  PAST SURGICAL HISTORY:  She had early abortions x2.  FAMILY HISTORY:  Father with generalized anxiety.  Mother had lung cancer and depression.  Her maternal grandmother had skin cancer.  Aunt had thyroid dysfunction and uncle had heart disease.  PAST OBSTETRIC HISTORY:  The patient had early abortions in 2004 and 2005, and a 32 week delivery in 2008 with delivery of a 4 pounds 5 ounce female infant.  She had shortening and funneling of the cervix beginning at 28 weeks and then had preterm premature rupture of the membranes. She denies alcohol, tobacco, and illicit substance abuse.  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  She went 2 years of college.  She is married.  PHYSICAL EXAMINATION:  GENERAL:  On admission, well-developed and well- nourished white female, having contractions.  VITAL SIGNS:  Her blood pressure is 111/71, temperature is 97.6, pulse is 107, respirations 18.  HEART:  Normal size and sounds.  No  murmurs.  LUNGS:  Clear to auscultation.  HEART:  Normal size and sounds.  No murmurs.  ABDOMEN:  Soft.  Fundal height consistent with dates.  Fetal heart tones normal.  Cervix 4 cm, 80% vertex at a -2.  Artificial rupture of the membranes produced clear fluid.  ADMITTING IMPRESSION:  Intrauterine pregnancy at 38 weeks and 0 days, history of preterm delivery, active labor.  The patient requested an epidural.  Labor will be monitored for progress.     Malachi Pro. Ambrose Mantle, M.D.     TFH/MEDQ  D:  06/26/2011  T:  06/26/2011  Job:  [id number]

## 2011-06-26 NOTE — Progress Notes (Signed)
Patient ID: Eileen Torres, female   DOB: 09-22-1985, 26 y.o.   MRN: [id number] Contractions are q 5 minutes The cervix is 4-5 cm 80-90 % effaced and the vertex is at -2 station Will start pitocin

## 2011-06-27 ENCOUNTER — Encounter (HOSPITAL_COMMUNITY): Payer: Self-pay | Admitting: Family Medicine

## 2011-06-27 LAB — CBC
MCHC: 31.8 g/dL (ref 30.0–36.0)
Platelets: 175 10*3/uL (ref 150–400)
RDW: 15.1 % (ref 11.5–15.5)
WBC: 24.2 10*3/uL — ABNORMAL HIGH (ref 4.0–10.5)

## 2011-06-27 LAB — RPR: RPR Ser Ql: NONREACTIVE

## 2011-06-27 MED ORDER — OXYTOCIN 40 UNITS IN LACTATED RINGERS INFUSION - SIMPLE MED: 62.5000 mL/h | INTRAVENOUS | Status: DC

## 2011-06-27 MED ORDER — MEASLES, MUMPS & RUBELLA VAC ~~LOC~~ INJ
0.5000 mL | INJECTION | Freq: Once | SUBCUTANEOUS | Status: DC
  Filled 2011-06-27: qty 0.5

## 2011-06-27 MED ORDER — DIPHENHYDRAMINE HCL 25 MG PO CAPS: 25.0000 mg | ORAL_CAPSULE | Freq: Four times a day (QID) | ORAL | Status: DC | PRN

## 2011-06-27 MED ORDER — DIBUCAINE 1 % RE OINT: 1.0000 "application " | TOPICAL_OINTMENT | RECTAL | Status: DC | PRN

## 2011-06-27 MED ORDER — TETANUS-DIPHTH-ACELL PERTUSSIS 5-2.5-18.5 LF-MCG/0.5 IM SUSP: 0.5000 mL | Freq: Once | INTRAMUSCULAR | Status: DC

## 2011-06-27 MED ORDER — WITCH HAZEL-GLYCERIN EX PADS: 1.0000 "application " | MEDICATED_PAD | CUTANEOUS | Status: DC | PRN

## 2011-06-27 MED ORDER — LANOLIN HYDROUS EX OINT: TOPICAL_OINTMENT | CUTANEOUS | Status: DC | PRN

## 2011-06-27 MED ORDER — ZOLPIDEM TARTRATE 5 MG PO TABS: 5.0000 mg | ORAL_TABLET | Freq: Every evening | ORAL | Status: DC | PRN

## 2011-06-27 MED ORDER — BENZOCAINE-MENTHOL 20-0.5 % EX AERO: 1.0000 "application " | INHALATION_SPRAY | CUTANEOUS | Status: DC | PRN

## 2011-06-27 MED ORDER — TETANUS-DIPHTH-ACELL PERTUSSIS 5-2.5-18.5 LF-MCG/0.5 IM SUSP
0.5000 mL | Freq: Once | INTRAMUSCULAR | Status: AC
  Administered 2011-06-28: 0.5 mL via INTRAMUSCULAR
  Filled 2011-06-27: qty 0.5

## 2011-06-27 MED ORDER — PRENATAL MULTIVITAMIN CH
1.0000 | ORAL_TABLET | Freq: Every day | ORAL | Status: DC
  Administered 2011-06-27 – 2011-06-29 (×3): 1 via ORAL
  Filled 2011-06-27 (×3): qty 1

## 2011-06-27 MED ORDER — SIMETHICONE 80 MG PO CHEW: 80.0000 mg | CHEWABLE_TABLET | ORAL | Status: DC | PRN

## 2011-06-27 MED ORDER — ONDANSETRON HCL 4 MG/2ML IJ SOLN: 4.0000 mg | INTRAMUSCULAR | Status: DC | PRN

## 2011-06-27 MED ORDER — BENZOCAINE-MENTHOL 20-0.5 % EX AERO
1.0000 "application " | INHALATION_SPRAY | CUTANEOUS | Status: DC | PRN
  Administered 2011-06-27: 1 via TOPICAL
  Filled 2011-06-27: qty 56

## 2011-06-27 MED ORDER — SENNOSIDES-DOCUSATE SODIUM 8.6-50 MG PO TABS
2.0000 | ORAL_TABLET | Freq: Every day | ORAL | Status: DC
  Administered 2011-06-27 – 2011-06-28 (×2): 2 via ORAL

## 2011-06-27 MED ORDER — SENNOSIDES-DOCUSATE SODIUM 8.6-50 MG PO TABS: 2.0000 | ORAL_TABLET | Freq: Every day | ORAL | Status: DC

## 2011-06-27 MED ORDER — IBUPROFEN 600 MG PO TABS
600.0000 mg | ORAL_TABLET | Freq: Four times a day (QID) | ORAL | Status: DC
  Administered 2011-06-27 – 2011-06-29 (×9): 600 mg via ORAL
  Filled 2011-06-27 (×9): qty 1

## 2011-06-27 MED ORDER — IBUPROFEN 600 MG PO TABS: 600.0000 mg | ORAL_TABLET | Freq: Four times a day (QID) | ORAL | Status: DC

## 2011-06-27 MED ORDER — PRENATAL MULTIVITAMIN CH: 1.0000 | ORAL_TABLET | Freq: Every day | ORAL | Status: DC

## 2011-06-27 MED ORDER — OXYCODONE-ACETAMINOPHEN 5-325 MG PO TABS
1.0000 | ORAL_TABLET | ORAL | Status: DC | PRN
  Administered 2011-06-27 – 2011-06-29 (×12): 1 via ORAL
  Filled 2011-06-27 (×10): qty 1
  Filled 2011-06-27: qty 2
  Filled 2011-06-27: qty 1

## 2011-06-27 MED ORDER — MEASLES, MUMPS & RUBELLA VAC ~~LOC~~ INJ: 0.5000 mL | INJECTION | Freq: Once | SUBCUTANEOUS | Status: DC

## 2011-06-27 MED ORDER — ONDANSETRON HCL 4 MG PO TABS: 4.0000 mg | ORAL_TABLET | ORAL | Status: DC | PRN

## 2011-06-27 MED ORDER — CALCIUM CARBONATE ANTACID 500 MG PO CHEW
1.0000 | CHEWABLE_TABLET | Freq: Three times a day (TID) | ORAL | Status: AC
  Administered 2011-06-27 (×2): 200 mg via ORAL
  Filled 2011-06-27 (×2): qty 1

## 2011-06-27 MED ORDER — OXYCODONE-ACETAMINOPHEN 5-325 MG PO TABS: 1.0000 | ORAL_TABLET | ORAL | Status: DC | PRN

## 2011-06-27 NOTE — Progress Notes (Signed)
Patient ID: Eileen Torres, female   DOB: 1985-12-23, 26 y.o.   MRN: [id number] Delivery note:  She became fully dilated at 23:33 and began pushing. She made good progress and delivered LOA over an intact perineum a living female infant with Apgars 8 and 9 at 1 and 5 minutes. There was mild shoulder dystocia that was managed with McRoberts and delivering the posterior arm A loop of cord prolapsed with the baby's head consistent with an occult cord prolapse. The placenta was intact and the uterus was normal. There were lacerations on the inner left labium minus and lateral to the right labium minus. The right was sutured with 3-0 vicryl under local block. EBL 400 cc's.

## 2011-06-27 NOTE — Progress Notes (Signed)
Patient was referred for history of depression/anxiety. * Referral screened out by Clinical Social Worker because none of the following criteria appear to apply: ~ History of anxiety/depression during this pregnancy, or of post-partum depression. ~ Diagnosis of anxiety and/or depression within last 3 years ~ History of depression due to pregnancy loss/loss of child OR * Patient's symptoms currently being treated with medication and/or therapy. Please contact the Clinical Social Worker if needs arise, or if patient requests.  There is a family hx of Anxiety/Depression documented in patient's prenatal record, but no documentation for patient herself in prenatal record.

## 2011-06-27 NOTE — Progress Notes (Signed)
Patient ID: Eileen Torres, female   DOB: [birth year], 26 y.o.   MRN: [id number] DOD VS normal no complaints except cramping

## 2011-06-27 NOTE — Progress Notes (Signed)
NSVD of a viable female.  

## 2011-06-27 NOTE — Progress Notes (Signed)
UR chart review completed.  

## 2011-06-28 LAB — CBC
MCH: 27.5 pg (ref 26.0–34.0)
Platelets: 184 10*3/uL (ref 150–400)
RBC: 3.35 MIL/uL — ABNORMAL LOW (ref 3.87–5.11)
WBC: 16 10*3/uL — ABNORMAL HIGH (ref 4.0–10.5)

## 2011-06-28 NOTE — Progress Notes (Signed)
Patient ID: Eileen Torres, female   DOB: [birth year], 26 y.o.   MRN: [id number] #1 afebrile BP normal She is crying but declines counseling Will try to get support to allow her to get some rest.

## 2011-06-28 NOTE — Anesthesia Postprocedure Evaluation (Signed)
  Anesthesia Post-op Note  Patient: Eileen Torres  Procedure(s) Performed: * No surgery found *  Patient Location: Mother/Baby  Anesthesia Type: Epidural  Level of Consciousness: awake  Airway and Oxygen Therapy: Patient Spontanous Breathing  Post-op Pain: none  Post-op Assessment: Post-op Vital signs reviewed  Post-op Vital Signs: Reviewed and stable  Complications: No apparent anesthesia complications

## 2011-06-29 MED ORDER — IBUPROFEN 600 MG PO TABS: 600.0000 mg | ORAL_TABLET | Freq: Four times a day (QID) | ORAL | Status: AC

## 2011-06-29 MED ORDER — OXYCODONE-ACETAMINOPHEN 5-325 MG PO TABS: 1.0000 | ORAL_TABLET | ORAL | Status: AC | PRN

## 2011-06-29 NOTE — Progress Notes (Signed)
Patient ID: Eileen Torres, female   DOB: Jul 30, 1985, 26 y.o.   MRN: [id number] #2 afebrile BP normal for D/C

## 2011-06-29 NOTE — Discharge Summary (Signed)
NAMEMarland Torres  ALEXANDERA, KUNTZMAN NO.:  [id number]  MEDICAL RECORD NO.:  [id number]  LOCATION:  9116                          [facility]ILITY:  [facility]  PHYSICIAN:  Malachi Pro. Ambrose Mantle, M.D. DATE OF BIRTH:  20-Dec-1985  DATE OF ADMISSION:  06/26/2011 DATE OF DISCHARGE:  06/29/2011                              DISCHARGE SUMMARY   This is a 26 year old white female, para 0-1-2-1, gravida 4, EDC July 10, 2011 admitted with contractions and progressive dilatation of the cervix.  Blood group and type O positive.  Negative antibody.  Pap smear normal.  Rubella immune.  RPR nonreactive.  Urine culture negative. Hepatitis B surface antigen negative, HIV negative, GC and Chlamydia negative.  She was too advanced for first trimester screening.  Quad screen was normal.  One hour Glucola 106.  Group B strep was negative. The patient's prenatal course is documented in her H and P.  When she was admitted to the hospital, she was thought to be 3.5 cm, 90% effaced, and vertex at a -2 with bulging membranes.  After admission, she was given an epidural by Dr. Cristela Blue.  By 9:36 p.m., her contractions were every 5 minutes.  The cervix was 4-5 cm, 80-90% effaced and vertex was at a -2 station.  Pitocin was begun to improve her contractions. The patient progressed to full dilatation at 11:33 p.m. and began pushing.  She made good progress and delivered LOA over an intact perineum, a living female infant with Apgars of 8 and 9 at 1 and 5 minutes.  There was mild shoulder dystocia that was managed with McRoberts and delivering the posterior arm, a loop of cord prolapsed with the baby's head consistent with an occult cord prolapse.  The placenta was intact.  Uterus was normal.  There were lacerations on the inner left labia minus and lateral to the right labia minus.  The right was sutured with 3-0 Vicryl.  Blood loss about 400 mL.  Postpartum the patient did very well.  She did have some episode of crying on  the first postpartum day but subsequently improved.  She declined counseling.  On the second postpartum day, she was afebrile.  Blood pressure was normal. She was ready for discharge.  LAB WORK:  Initial hemoglobin was 10.5, hematocrit 31.8, white count 24,400.  RPR was nonreactive.  Followup hemoglobin was 9.5, hematocrit 29.9, white count 24200, and on June 28, 2011, the hemoglobin was 9.2, hematocrit 28.1, white count 16000.  FINAL DIAGNOSES:  Intrauterine pregnancy at 38 weeks delivered vertex, left occipitoanterior.  OPERATION:  Spontaneous delivery LOA, repair of right labial laceration.  ADDITIONAL DIAGNOSIS:  Occult cord prolapse.  Discharge instructions include our regular discharge instruction booklet, the after visit summary.  MEDICATIONS: 1. Motrin 600 mg, 30 tablets, 1 every 6 hours as needed for pain. 2. Percocet 5/325, 20 tablets, 1 every 6 hours as needed for pain.  She is asked to return to see Dr. Senaida Ores in 6 weeks because they have discussed during the pregnancy permanent sterilization at the 6 weeks checkup.     Malachi Pro. Ambrose Mantle, M.D.     TFH/MEDQ  D:  06/29/2011  T:  06/29/2011  Job:  [id number] 

## 2011-06-29 NOTE — Discharge Instructions (Signed)
booklet °

## 2011-07-04 ENCOUNTER — Inpatient Hospital Stay (HOSPITAL_COMMUNITY): Admission: RE | Admit: 2011-07-04 | Payer: Medicaid Other | Source: Ambulatory Visit

## 2011-09-04 DIAGNOSIS — Z302 Encounter for sterilization: Secondary | ICD-10-CM

## 2011-09-04 HISTORY — DX: Encounter for sterilization: Z30.2

## 2013-08-12 ENCOUNTER — Emergency Department (INDEPENDENT_AMBULATORY_CARE_PROVIDER_SITE_OTHER)
Admission: EM | Admit: 2013-08-12 | Discharge: 2013-08-12 | Disposition: A | Discharge status: Home / Self Care | Payer: Self-pay | Source: Home / Self Care | Attending: Family Medicine | Admitting: Family Medicine

## 2013-08-12 ENCOUNTER — Encounter (HOSPITAL_COMMUNITY): Payer: Self-pay | Admitting: Emergency Medicine

## 2013-08-12 DIAGNOSIS — L02413 Cutaneous abscess of right upper limb: Secondary | ICD-10-CM

## 2013-08-12 DIAGNOSIS — IMO0002 Reserved for concepts with insufficient information to code with codable children: Secondary | ICD-10-CM

## 2013-08-12 MED ORDER — MINOCYCLINE HCL 100 MG PO CAPS: 100.0000 mg | ORAL_CAPSULE | Freq: Two times a day (BID) | ORAL | Status: DC

## 2013-08-12 NOTE — ED Notes (Signed)
Patient has right upper arm oozing wound.  Redness and swelling present.  Patient noticed this 2 days ago.  Reports history of mrsa.

## 2013-08-12 NOTE — ED Provider Notes (Signed)
Medical screening examination/treatment/procedure(s) were performed by a resident physician or non-physician practitioner and as the supervising physician I was immediately available for consultation/collaboration.  Arnice Vanepps, MD Family Medicine   Cicilia Clinger J Iman Orourke, MD 08/12/13 2136 

## 2013-08-12 NOTE — ED Provider Notes (Signed)
CSN: [id number]     Arrival date & time 08/12/13  1125 History   First MD Initiated Contact with Patient 08/12/13 1224     Chief Complaint  Patient presents with  . Abscess   (Consider location/radiation/quality/duration/timing/severity/associated sxs/prior Treatment) Patient is a 28 y.o. female presenting with abscess. The history is provided by the patient.  Abscess Location:  Shoulder/arm Shoulder/arm abscess location:  R upper arm Size:  2.5cm x 3 cm Abscess quality: draining, induration, painful, redness and warmth   Red streaking: no   Duration:  3 days Progression:  Worsening Chronicity:  New Context: not diabetes, not immunosuppression, not injected drug use, not insect bite/sting and not skin injury   Associated symptoms: no fatigue, no fever and no nausea   Risk factors: prior abscess   Risk factors: no hx of MRSA     Past Medical History  Diagnosis Date  . Migraines   . Depression   . Anxiety   . HSV (herpes simplex virus) infection 2009    ?outbreak could be HSV last pregnancy-not definate diagnosis per pt and chart- pt on Valtrex prophylacttcally   Past Surgical History  Procedure Laterality Date  . No past surgeries     Family History  Problem Relation Age of Onset  . Cancer Other   . Diabetes Other   . Heart failure Other   . Stroke Other    History  Substance Use Topics  . Smoking status: Current Every Day Smoker -- 0.25 packs/day for 12 years    Types: Cigarettes  . Smokeless tobacco: Not on file  . Alcohol Use: No   OB History   Grav Para Term Preterm Abortions TAB SAB Ect Mult Living   2 2 1 1      2      Review of Systems  Constitutional: Negative for fever and fatigue.  Gastrointestinal: Negative for nausea.  All other systems reviewed and are negative.   Allergies  Review of patient's allergies indicates no known allergies.  Home Medications   Prior to Admission medications   Medication Sig Start Date End Date Taking? Authorizing  Provider  calcium carbonate (TUMS - DOSED IN MG ELEMENTAL CALCIUM) 500 MG chewable tablet Chew 1 tablet by mouth as needed. For heartburn    Historical Provider, MD  minocycline (MINOCIN) 100 MG capsule Take 1 capsule (100 mg total) by mouth 2 (two) times daily. X 7 days 08/12/13   Mathis Fare Greene Diodato, PA   BP 125/76  Pulse 97  Temp(Src) 99 F (37.2 C) (Oral)  Resp 14  SpO2 98%  LMP 07/16/2013  Breastfeeding? No Physical Exam  Nursing note and vitals reviewed. Constitutional: She is oriented to person, place, and time. She appears well-developed and well-nourished. No distress.  HENT:  Head: Normocephalic and atraumatic.  Eyes: Conjunctivae are normal. No scleral icterus.  Cardiovascular: Normal rate.   Pulmonary/Chest: Effort normal.  Musculoskeletal: Normal range of motion.  Neurological: She is alert and oriented to person, place, and time.  Skin: Skin is warm and dry. No rash noted. There is erythema.  Small 2cm x 3 cm indurated erythematous abscess with small area of central fluctuance at right anterior forearm.   Psychiatric: She has a normal mood and affect. Her behavior is normal.    ED Course  INCISION AND DRAINAGE Date/Time: 08/12/2013 1:42 PM Performed by: Lemmie Evens LEE H Authorized by: Konrad Dolores, DAVID J Consent: Verbal consent obtained. written consent not obtained. Risks and benefits: risks, benefits and alternatives  were discussed Consent given by: patient Patient identity confirmed: verbally with patient Time out: Immediately prior to procedure a "time out" was called to verify the correct patient, procedure, equipment, support staff and site/side marked as required. Type: abscess Body area: upper extremity Location details: right arm Anesthesia: local infiltration Local anesthetic: lidocaine 2% with epinephrine Anesthetic total: 2 ml Patient sedated: no Scalpel size: 11 Incision type: single straight Complexity: simple Drainage:  purulent Drainage amount: scant Wound treatment: wound left open Packing material: none Patient tolerance: Patient tolerated the procedure well with no immediate complications.   (including critical care time) Labs Review Labs Reviewed - No data to display  Imaging Review No results found.   MDM   1. Abscess of right arm   I&D as above. Warm compresses, elevation and Minocycline as prescribed with follow up if no improvement.  Printed instructions regarding I&D aftercare provided to patient.    Ria ClockJennifer Lee H Vernestine Brodhead, [place] 08/12/13 1349

## 2013-08-12 NOTE — Discharge Instructions (Signed)
Abscess  An abscess is an infected area that contains a collection of pus and debris.It can occur in almost any part of the body. An abscess is also known as a furuncle or boil.  CAUSES   An abscess occurs when tissue gets infected. This can occur from blockage of oil or sweat glands, infection of hair follicles, or a minor injury to the skin. As the body tries to fight the infection, pus collects in the area and creates pressure under the skin. This pressure causes pain. People with weakened immune systems have difficulty fighting infections and get certain abscesses more often.   SYMPTOMS  Usually an abscess develops on the skin and becomes a painful mass that is red, warm, and tender. If the abscess forms under the skin, you may feel a moveable soft area under the skin. Some abscesses break open (rupture) on their own, but most will continue to get worse without care. The infection can spread deeper into the body and eventually into the bloodstream, causing you to feel ill.   DIAGNOSIS   Your caregiver will take your medical history and perform a physical exam. A sample of fluid may also be taken from the abscess to determine what is causing your infection.  TREATMENT   Your caregiver may prescribe antibiotic medicines to fight the infection. However, taking antibiotics alone usually does not cure an abscess. Your caregiver may need to make a small cut (incision) in the abscess to drain the pus. In some cases, gauze is packed into the abscess to reduce pain and to continue draining the area.  HOME CARE INSTRUCTIONS    Only take over-the-counter or prescription medicines for pain, discomfort, or fever as directed by your caregiver.   If you were prescribed antibiotics, take them as directed. Finish them even if you start to feel better.   If gauze is used, follow your caregiver's directions for changing the gauze.   To avoid spreading the infection:   Keep your draining abscess covered with a  bandage.   Wash your hands well.   Do not share personal care items, towels, or whirlpools with others.   Avoid skin contact with others.   Keep your skin and clothes clean around the abscess.   Keep all follow-up appointments as directed by your caregiver.  SEEK MEDICAL CARE IF:    You have increased pain, swelling, redness, fluid drainage, or bleeding.   You have muscle aches, chills, or a general ill feeling.   You have a fever.  MAKE SURE YOU:    Understand these instructions.   Will watch your condition.   Will get help right away if you are not doing well or get worse.  Document Released: 09/29/2004 Document Revised: 06/21/2011 Document Reviewed: 03/04/2011  Exit[employer] Patient Information 2015 [employer]. This information is not intended to replace advice given to you by your health care provider. Make sure you discuss any questions you have with your health care provider.  Incision and Drainage  Incision and drainage is a procedure in which a sac-like structure (cystic structure) is opened and drained. The area to be drained usually contains material such as pus, fluid, or blood.   LET YOUR CAREGIVER KNOW ABOUT:    Allergies to medicine.   Medicines taken, including vitamins, herbs, eyedrops, over-the-counter medicines, and creams.   Use of steroids (by mouth or creams).   Previous problems with anesthetics or numbing medicines.   History of bleeding problems or blood clots.     Previous surgery.   Other health problems, including diabetes and kidney problems.   Possibility of pregnancy, if this applies.  RISKS AND COMPLICATIONS   Pain.   Bleeding.   Scarring.   Infection.  BEFORE THE PROCEDURE   You may need to have an ultrasound or other imaging tests to see how large or deep your cystic structure is. Blood tests may also be used to determine if you have an infection or how severe the infection is. You may need to have a tetanus shot.  PROCEDURE   The affected area is cleaned with a  cleaning fluid. The cyst area will then be numbed with a medicine (local anesthetic). A small incision will be made in the cystic structure. A syringe or catheter may be used to drain the contents of the cystic structure, or the contents may be squeezed out. The area will then be flushed with a cleansing solution. After cleansing the area, it is often gently packed with a gauze or another wound dressing. Once it is packed, it will be covered with gauze and tape or some other type of wound dressing.  AFTER THE PROCEDURE    Often, you will be allowed to go home right after the procedure.   You may be given antibiotic medicine to prevent or heal an infection.   If the area was packed with gauze or some other wound dressing, you will likely need to come back in 1 to 2 days to get it removed.   The area should heal in about 14 days.  Document Released: 06/15/2000 Document Revised: 06/21/2011 Document Reviewed: 02/14/2011  [employer] Patient Information 2015 [employer]. This information is not intended to replace advice given to you by your health care provider. Make sure you discuss any questions you have with your health care provider.

## 2013-10-08 ENCOUNTER — Encounter (HOSPITAL_COMMUNITY): Payer: Self-pay | Admitting: Emergency Medicine

## 2013-10-08 ENCOUNTER — Emergency Department (INDEPENDENT_AMBULATORY_CARE_PROVIDER_SITE_OTHER)
Admission: EM | Admit: 2013-10-08 | Discharge: 2013-10-08 | Disposition: A | Discharge status: Home / Self Care | Payer: Self-pay | Source: Home / Self Care | Attending: Emergency Medicine | Admitting: Emergency Medicine

## 2013-10-08 DIAGNOSIS — N12 Tubulo-interstitial nephritis, not specified as acute or chronic: Secondary | ICD-10-CM

## 2013-10-08 HISTORY — DX: Encounter for sterilization: Z30.2

## 2013-10-08 LAB — POCT URINALYSIS DIP (DEVICE)
Bilirubin Urine: NEGATIVE
Glucose, UA: NEGATIVE mg/dL
KETONES UR: NEGATIVE mg/dL
Nitrite: NEGATIVE
Protein, ur: 100 mg/dL — AB
Specific Gravity, Urine: 1.02 (ref 1.005–1.030)
UROBILINOGEN UA: 0.2 mg/dL (ref 0.0–1.0)
pH: 6.5 (ref 5.0–8.0)

## 2013-10-08 LAB — POCT PREGNANCY, URINE: PREG TEST UR: NEGATIVE

## 2013-10-08 MED ORDER — CEFTRIAXONE SODIUM 1 G IJ SOLR
INTRAMUSCULAR | Status: AC
  Filled 2013-10-08: qty 10

## 2013-10-08 MED ORDER — ONDANSETRON HCL 4 MG PO TABS: 4.0000 mg | ORAL_TABLET | Freq: Three times a day (TID) | ORAL | Status: DC | PRN

## 2013-10-08 MED ORDER — ONDANSETRON 4 MG PO TBDP
4.0000 mg | ORAL_TABLET | Freq: Once | ORAL | Status: AC
  Administered 2013-10-08: 4 mg via ORAL

## 2013-10-08 MED ORDER — LIDOCAINE HCL (PF) 1 % IJ SOLN
INTRAMUSCULAR | Status: AC
  Filled 2013-10-08: qty 5

## 2013-10-08 MED ORDER — KETOROLAC TROMETHAMINE 30 MG/ML IJ SOLN
30.0000 mg | Freq: Once | INTRAMUSCULAR | Status: AC
  Administered 2013-10-08: 30 mg via INTRAMUSCULAR

## 2013-10-08 MED ORDER — CEFTRIAXONE SODIUM 250 MG IJ SOLR
1000.0000 mg | Freq: Once | INTRAMUSCULAR | Status: AC
  Administered 2013-10-08: 1000 mg via INTRAMUSCULAR

## 2013-10-08 MED ORDER — TRAMADOL HCL 50 MG PO TABS: 50.0000 mg | ORAL_TABLET | Freq: Two times a day (BID) | ORAL | Status: DC | PRN

## 2013-10-08 MED ORDER — CIPROFLOXACIN HCL 500 MG PO TABS: 500.0000 mg | ORAL_TABLET | Freq: Two times a day (BID) | ORAL | Status: DC

## 2013-10-08 MED ORDER — KETOROLAC TROMETHAMINE 30 MG/ML IJ SOLN
INTRAMUSCULAR | Status: AC
  Filled 2013-10-08: qty 1

## 2013-10-08 MED ORDER — ONDANSETRON 4 MG PO TBDP
ORAL_TABLET | ORAL | Status: AC
  Filled 2013-10-08: qty 1

## 2013-10-08 NOTE — ED Notes (Signed)
C/o burning and urgency with urination onset Sunday.  Had blood in her urine yesterday AM. C/o R flank pain today.  Taking AZO.  No vaginal discharge.  LMP 9/14.

## 2013-10-08 NOTE — ED Provider Notes (Signed)
CSN: [id number]     Arrival date & time 10/08/13  1832 History   First MD Initiated Contact with Patient 10/08/13 1900     Chief Complaint  Patient presents with  . Flank Pain  . Urinary Tract Infection   (Consider location/radiation/quality/duration/timing/severity/associated sxs/prior Treatment) HPI Comments: PCP: none LNMP: 09/16/2013  Patient is a 28 y.o. female presenting with urinary tract infection. The history is provided by the patient.  Urinary Tract Infection This is a new problem. Episode onset: sx began 10/06/2013. The problem occurs constantly. The problem has been gradually worsening. Associated symptoms include abdominal pain. Associated symptoms comments: +dysuria, frequency, hematuria and right flank pain.    Past Medical History  Diagnosis Date  . Migraines   . Depression   . Anxiety   . HSV (herpes simplex virus) infection 2009    ?outbreak could be HSV last pregnancy-not definate diagnosis per pt and chart- pt on Valtrex prophylacttcally  . Asthma   . Encounter for Essure implantation 09/2011   Past Surgical History  Procedure Laterality Date  . No past surgeries     Family History  Problem Relation Age of Onset  . Cancer Other   . Diabetes Other   . Heart failure Other   . Stroke Other    History  Substance Use Topics  . Smoking status: Former Smoker -- 0.25 packs/day for 12 years    Types: Cigarettes    Quit date: 09/27/2013  . Smokeless tobacco: Not on file  . Alcohol Use: No   OB History   Grav Para Term Preterm Abortions TAB SAB Ect Mult Living   2 2 1 1      2      Review of Systems  Constitutional: Negative for fever.  HENT: Negative.   Eyes: Negative.   Respiratory: Negative.   Cardiovascular: Negative.   Gastrointestinal: Positive for abdominal pain.  Genitourinary: Positive for dysuria, urgency, frequency, hematuria and flank pain.  Musculoskeletal: Positive for back pain.  Skin: Negative.   All other systems reviewed and are  negative.   Allergies  Review of patient's allergies indicates no known allergies.  Home Medications   Prior to Admission medications   Medication Sig Start Date End Date Taking? Authorizing Provider  calcium carbonate (TUMS - DOSED IN MG ELEMENTAL CALCIUM) 500 MG chewable tablet Chew 1 tablet by mouth as needed. For heartburn   Yes Historical Provider, MD  nicotine (NICODERM CQ - DOSED IN MG/24 HOURS) 21 mg/24hr patch Place 21 mg onto the skin daily.   Yes Historical Provider, MD  ciprofloxacin (CIPRO) 500 MG tablet Take 1 tablet (500 mg total) by mouth every 12 (twelve) hours. X 10 days 10/08/13   Mathis Fare Gerster Paone, PA  minocycline (MINOCIN) 100 MG capsule Take 1 capsule (100 mg total) by mouth 2 (two) times daily. X 7 days 08/12/13   Jess Barters H Kalimah Capurro, PA  ondansetron (ZOFRAN) 4 MG tablet Take 1 tablet (4 mg total) by mouth every 8 (eight) hours as needed for nausea or vomiting. 10/08/13   Mathis Fare Tashana Haberl, PA  traMADol (ULTRAM) 50 MG tablet Take 1 tablet (50 mg total) by mouth every 12 (twelve) hours as needed for moderate pain or severe pain. 10/08/13   Mathis Fare Miller Ed[place], PA   BP 119/78  Pulse 92  Temp(Src) 98.6 F (37 C) (Oral)  Resp 16  SpO2 99%  LMP 09/16/2013 Physical Exam  Nursing note and vitals reviewed. Constitutional: She is oriented to person,  place, and time. She appears well-developed and well-nourished. No distress.  HENT:  Head: Normocephalic and atraumatic.  Eyes: Conjunctivae are normal. No scleral icterus.  Neck: Normal range of motion. Neck supple.  Cardiovascular: Normal rate, regular rhythm and normal heart sounds.   Pulmonary/Chest: Effort normal and breath sounds normal.  Abdominal: Soft. Normal appearance and bowel sounds are normal. There is tenderness in the suprapubic area. There is CVA tenderness.  + R CVAT  Musculoskeletal: Normal range of motion.  Neurological: She is alert and oriented to person, place, and time.  Skin: Skin is  warm and dry.  Psychiatric: She has a normal mood and affect. Her behavior is normal.    ED Course  Procedures (including critical care time) Labs Review Labs Reviewed  POCT URINALYSIS DIP (DEVICE) - Abnormal; Notable for the following:    Hgb urine dipstick MODERATE (*)    Protein, ur 100 (*)    Leukocytes, UA SMALL (*)    All other components within normal limits  URINE CULTURE  POCT PREGNANCY, URINE    Imaging Review No results found.   MDM   1. Pyelonephritis    Exam and labs suggest right acute pyelonephritis. +UPT negative Given 1 gram IM rocephin and 30 mg IM toradol and zofran 4mg  [facility] Will continue treatment at home with 10 day course of cipro BID and advise follow up if no improvement.    Ria ClockJennifer Lee H Mithran Strike, GeorgiaPA 10/08/13 [id number]

## 2013-10-08 NOTE — ED Provider Notes (Signed)
Medical screening examination/treatment/procedure(s) were performed by non-physician practitioner and as supervising physician I was immediately available for consultation/collaboration.  Leslee Homeavid Selinda Korzeniewski, M.D.  Reuben Likesavid C Mutasim Tuckey, MD 10/08/13 2111

## 2013-10-08 NOTE — Discharge Instructions (Signed)
Pyelonephritis, Adult °Pyelonephritis is a kidney infection. In general, there are 2 main types of pyelonephritis: °· Infections that come on quickly without any warning (acute pyelonephritis). °· Infections that persist for a long period of time (chronic pyelonephritis). °CAUSES  °Two main causes of pyelonephritis are: °· Bacteria traveling from the bladder to the kidney. This is a problem especially in pregnant women. The urine in the bladder can become filled with bacteria from multiple causes, including: °¨ Inflammation of the prostate gland (prostatitis). °¨ Sexual intercourse in females. °¨ Bladder infection (cystitis). °· Bacteria traveling from the bloodstream to the tissue part of the kidney. °Problems that may increase your risk of getting a kidney infection include: °· Diabetes. °· Kidney stones or bladder stones. °· Cancer. °· Catheters placed in the bladder. °· Other abnormalities of the kidney or ureter. °SYMPTOMS  °· Abdominal pain. °· Pain in the side or flank area. °· Fever. °· Chills. °· Upset stomach. °· Blood in the urine (dark urine). °· Frequent urination. °· Strong or persistent urge to urinate. °· Burning or stinging when urinating. °DIAGNOSIS  °Your caregiver may diagnose your kidney infection based on your symptoms. A urine sample may also be taken. °TREATMENT  °In general, treatment depends on how severe the infection is.  °· If the infection is mild and caught early, your caregiver may treat you with oral antibiotics and send you home. °· If the infection is more severe, the bacteria may have gotten into the bloodstream. This will require intravenous (IV) antibiotics and a hospital stay. Symptoms may include: °¨ High fever. °¨ Severe flank pain. °¨ Shaking chills. °· Even after a hospital stay, your caregiver may require you to be on oral antibiotics for a period of time. °· Other treatments may be required depending upon the cause of the infection. °HOME CARE INSTRUCTIONS  °· Take your  antibiotics as directed. Finish them even if you start to feel better. °· Make an appointment to have your urine checked to make sure the infection is gone. °· Drink enough fluids to keep your urine clear or pale yellow. °· Take medicines for the bladder if you have urgency and frequency of urination as directed by your caregiver. °SEEK IMMEDIATE MEDICAL CARE IF:  °· You have a fever or persistent symptoms for more than 2-3 days. °· You have a fever and your symptoms suddenly get worse. °· You are unable to take your antibiotics or fluids. °· You develop shaking chills. °· You experience extreme weakness or fainting. °· There is no improvement after 2 days of treatment. °MAKE SURE YOU: °· Understand these instructions. °· Will watch your condition. °· Will get help right away if you are not doing well or get worse. °Document Released: 12/20/2004 Document Revised: 06/21/2011 Document Reviewed: 05/26/2010 °ExitCare® Patient Information ©2015 ExitCare, LLC. This information is not intended to replace advice given to you by your health care provider. Make sure you discuss any questions you have with your health care provider. ° °

## 2013-10-11 LAB — URINE CULTURE
Colony Count: >=100000
Special Requests: NORMAL

## 2013-10-11 NOTE — ED Notes (Signed)
Urine culture: >100,000 colonies E. Coli.  Pt. adequately treated with Cipro. Vassie MoselleYork, Chace Bisch M 10/11/2013

## 2013-11-04 ENCOUNTER — Encounter (HOSPITAL_COMMUNITY): Payer: Self-pay | Admitting: Emergency Medicine

## 2013-11-06 ENCOUNTER — Emergency Department (HOSPITAL_COMMUNITY): Payer: No Typology Code available for payment source

## 2013-11-06 ENCOUNTER — Emergency Department (HOSPITAL_COMMUNITY)
Admission: EM | Admit: 2013-11-06 | Discharge: 2013-11-06 | Disposition: A | Discharge status: Home / Self Care | Payer: No Typology Code available for payment source | Attending: Emergency Medicine | Admitting: Emergency Medicine

## 2013-11-06 ENCOUNTER — Encounter (HOSPITAL_COMMUNITY): Payer: Self-pay | Admitting: Emergency Medicine

## 2013-11-06 DIAGNOSIS — Y9389 Activity, other specified: Secondary | ICD-10-CM | POA: Diagnosis not present

## 2013-11-06 DIAGNOSIS — S299XXA Unspecified injury of thorax, initial encounter: Secondary | ICD-10-CM | POA: Diagnosis not present

## 2013-11-06 DIAGNOSIS — Z79899 Other long term (current) drug therapy: Secondary | ICD-10-CM | POA: Insufficient documentation

## 2013-11-06 DIAGNOSIS — Z8619 Personal history of other infectious and parasitic diseases: Secondary | ICD-10-CM | POA: Diagnosis not present

## 2013-11-06 DIAGNOSIS — R000 Tachycardia, unspecified: Secondary | ICD-10-CM | POA: Diagnosis not present

## 2013-11-06 DIAGNOSIS — Z8679 Personal history of other diseases of the circulatory system: Secondary | ICD-10-CM | POA: Insufficient documentation

## 2013-11-06 DIAGNOSIS — S79911A Unspecified injury of right hip, initial encounter: Secondary | ICD-10-CM | POA: Diagnosis not present

## 2013-11-06 DIAGNOSIS — Y92410 Unspecified street and highway as the place of occurrence of the external cause: Secondary | ICD-10-CM | POA: Insufficient documentation

## 2013-11-06 DIAGNOSIS — Z87891 Personal history of nicotine dependence: Secondary | ICD-10-CM | POA: Diagnosis not present

## 2013-11-06 DIAGNOSIS — F329 Major depressive disorder, single episode, unspecified: Secondary | ICD-10-CM | POA: Insufficient documentation

## 2013-11-06 DIAGNOSIS — J45909 Unspecified asthma, uncomplicated: Secondary | ICD-10-CM | POA: Insufficient documentation

## 2013-11-06 DIAGNOSIS — S0091XA Abrasion of unspecified part of head, initial encounter: Secondary | ICD-10-CM | POA: Diagnosis not present

## 2013-11-06 DIAGNOSIS — S0990XA Unspecified injury of head, initial encounter: Secondary | ICD-10-CM | POA: Diagnosis present

## 2013-11-06 DIAGNOSIS — Z3202 Encounter for pregnancy test, result negative: Secondary | ICD-10-CM | POA: Diagnosis not present

## 2013-11-06 DIAGNOSIS — Z792 Long term (current) use of antibiotics: Secondary | ICD-10-CM | POA: Diagnosis not present

## 2013-11-06 DIAGNOSIS — Z23 Encounter for immunization: Secondary | ICD-10-CM | POA: Insufficient documentation

## 2013-11-06 DIAGNOSIS — F419 Anxiety disorder, unspecified: Secondary | ICD-10-CM | POA: Insufficient documentation

## 2013-11-06 LAB — BASIC METABOLIC PANEL
ANION GAP: 14 (ref 5–15)
BUN: 10 mg/dL (ref 6–23)
CHLORIDE: 109 meq/L (ref 96–112)
CO2: 20 meq/L (ref 19–32)
Calcium: 8.1 mg/dL — ABNORMAL LOW (ref 8.4–10.5)
Creatinine, Ser: 0.63 mg/dL (ref 0.50–1.10)
GFR calc Af Amer: >90 mL/min (ref >90)
GFR calc non Af Amer: >90 mL/min (ref >90)
Glucose, Bld: 97 mg/dL (ref 70–99)
Potassium: 3.9 mEq/L (ref 3.7–5.3)
SODIUM: 143 meq/L (ref 137–147)

## 2013-11-06 LAB — CBC WITH DIFFERENTIAL/PLATELET
Basophils Absolute: 0 10*3/uL (ref 0.0–0.1)
Basophils Relative: 0 % (ref 0–1)
Eosinophils Absolute: 0.1 10*3/uL (ref 0.0–0.7)
Eosinophils Relative: 1 % (ref 0–5)
HEMATOCRIT: 39.1 % (ref 36.0–46.0)
Hemoglobin: 13.2 g/dL (ref 12.0–15.0)
LYMPHS PCT: 14 % (ref 12–46)
Lymphs Abs: 2.6 10*3/uL (ref 0.7–4.0)
MCH: 29.3 pg (ref 26.0–34.0)
MCHC: 33.8 g/dL (ref 30.0–36.0)
MCV: 86.7 fL (ref 78.0–100.0)
Monocytes Absolute: 1.6 10*3/uL — ABNORMAL HIGH (ref 0.1–1.0)
Monocytes Relative: 9 % (ref 3–12)
NEUTROS ABS: 14 10*3/uL — AB (ref 1.7–7.7)
NEUTROS PCT: 76 % (ref 43–77)
Platelets: 239 10*3/uL (ref 150–400)
RBC: 4.51 MIL/uL (ref 3.87–5.11)
RDW: 14.5 % (ref 11.5–15.5)
WBC: 18.3 10*3/uL — ABNORMAL HIGH (ref 4.0–10.5)

## 2013-11-06 LAB — I-STAT TROPONIN, ED: TROPONIN I, POC: 0 ng/mL (ref 0.00–0.08)

## 2013-11-06 LAB — I-STAT CG4 LACTIC ACID, ED: Lactic Acid, Venous: 1.52 mmol/L (ref 0.5–2.2)

## 2013-11-06 LAB — POC URINE PREG, ED: Preg Test, Ur: NEGATIVE

## 2013-11-06 MED ORDER — SODIUM CHLORIDE 0.9 % IV BOLUS (SEPSIS)
1000.0000 mL | Freq: Once | INTRAVENOUS | Status: AC
  Administered 2013-11-06: 1000 mL via INTRAVENOUS

## 2013-11-06 MED ORDER — TETANUS-DIPHTH-ACELL PERTUSSIS 5-2.5-18.5 LF-MCG/0.5 IM SUSP
0.5000 mL | Freq: Once | INTRAMUSCULAR | Status: AC
  Administered 2013-11-06: 0.5 mL via INTRAMUSCULAR
  Filled 2013-11-06: qty 0.5

## 2013-11-06 MED ORDER — MORPHINE SULFATE 4 MG/ML IJ SOLN
6.0000 mg | Freq: Once | INTRAMUSCULAR | Status: AC
  Administered 2013-11-06: 6 mg via INTRAVENOUS
  Filled 2013-11-06: qty 2

## 2013-11-06 MED ORDER — MORPHINE SULFATE 4 MG/ML IJ SOLN
4.0000 mg | Freq: Once | INTRAMUSCULAR | Status: AC
Start: 2013-11-06 — End: 2013-11-06
  Administered 2013-11-06: 4 mg via INTRAVENOUS
  Filled 2013-11-06: qty 1

## 2013-11-06 MED ORDER — IBUPROFEN 800 MG PO TABS: 800.0000 mg | ORAL_TABLET | Freq: Three times a day (TID) | ORAL | Status: DC

## 2013-11-06 MED ORDER — HYDROCODONE-ACETAMINOPHEN 5-325 MG PO TABS: 1.0000 | ORAL_TABLET | Freq: Two times a day (BID) | ORAL | Status: DC | PRN

## 2013-11-06 MED ORDER — SODIUM CHLORIDE 0.9 % IV BOLUS (SEPSIS)
2000.0000 mL | Freq: Once | INTRAVENOUS | Status: AC
  Administered 2013-11-06: 2000 mL via INTRAVENOUS

## 2013-11-06 MED ORDER — ONDANSETRON HCL 4 MG/2ML IJ SOLN
4.0000 mg | Freq: Once | INTRAMUSCULAR | Status: AC
  Administered 2013-11-06: 4 mg via INTRAVENOUS
  Filled 2013-11-06: qty 2

## 2013-11-06 MED ORDER — IOHEXOL 300 MG/ML  SOLN
100.0000 mL | Freq: Once | INTRAMUSCULAR | Status: AC | PRN
  Administered 2013-11-06: 100 mL via INTRAVENOUS

## 2013-11-06 NOTE — ED Notes (Signed)
Patient is loud and upset.  Says she wants something for pain, and something to drink or she's going to throw up.  I advised her that as soon as the MD was available I would advise him of her requests.
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
## 2013-11-06 NOTE — ED Notes (Signed)
Patient is alert and orientedx4.  Patient was explained discharge instructions and they understood them with no questions.  The patient's father, Eileen Torres is taking the patient home.
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
## 2014-03-26 ENCOUNTER — Encounter (HOSPITAL_COMMUNITY): Payer: Self-pay | Admitting: *Deleted

## 2014-03-26 ENCOUNTER — Emergency Department (INDEPENDENT_AMBULATORY_CARE_PROVIDER_SITE_OTHER): Payer: Self-pay

## 2014-03-26 ENCOUNTER — Emergency Department (INDEPENDENT_AMBULATORY_CARE_PROVIDER_SITE_OTHER)
Admission: EM | Admit: 2014-03-26 | Discharge: 2014-03-26 | Disposition: A | Discharge status: Home / Self Care | Payer: Self-pay | Source: Home / Self Care | Attending: Family Medicine | Admitting: Family Medicine

## 2014-03-26 DIAGNOSIS — S9031XA Contusion of right foot, initial encounter: Secondary | ICD-10-CM

## 2014-03-26 MED ORDER — DICLOFENAC POTASSIUM 50 MG PO TABS: 50.0000 mg | ORAL_TABLET | Freq: Three times a day (TID) | ORAL | Status: DC

## 2014-03-26 MED ORDER — EYE WASH OPHTH SOLN
OPHTHALMIC | Status: AC
  Filled 2014-03-26: qty 118
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
## 2014-03-26 NOTE — ED Notes (Signed)
Wrap  Applied by  [employer]    sneed

## 2014-03-26 NOTE — Discharge Instructions (Signed)
Foot Contusion Ice , elevation, wear wrap, limit weight bearing for a couple of days.  A foot contusion is a deep bruise to the foot. Contusions happen when an injury causes bleeding under the skin. Signs of bruising include pain, puffiness (swelling), and discolored skin. The contusion may turn blue, purple, or yellow. HOME CARE  Put ice on the injured area.  Put ice in a plastic bag.  Place a towel between your skin and the bag.  Leave the ice on for 15-20 minutes, 03-04 times a day.  Only take medicines as told by your doctor.  Use an elastic wrap only as told. You may remove the wrap for sleeping, showering, and bathing. Take the wrap off if you lose feeling (numb) in your toes, or they turn blue or cold. Put the wrap on more loosely.  Keep the foot raised (elevated) with pillows.  If your foot hurts, avoid standing or walking.  When your doctor says it is okay to use your foot, start using it slowly. If you have pain, lessen how much you use your foot.  See your doctor as told. GET HELP RIGHT AWAY IF:   You have more redness, puffiness, or pain in your foot.  Your puffiness or pain does not get better with medicine.  You lose feeling in your foot, or you cannot move your toes.  Your foot turns cold or blue.  You have pain when you move your toes.  Your foot feels warm.  Your contusion does not get better in 2 days. MAKE SURE YOU:   Understand these instructions.  Will watch this condition.  Will get help right away if you or your child is not doing well or gets worse. Document Released: 09/29/2007 Document Revised: 06/21/2011 Document Reviewed: 11/23/2010 [facility] Patient Information 2015 [employer] [place]. This information is not intended to replace advice given to you by your health care provider. Make sure you discuss any questions you have with your health care provider.
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
## 2014-06-07 ENCOUNTER — Encounter (HOSPITAL_COMMUNITY): Payer: Self-pay | Admitting: *Deleted

## 2014-06-07 ENCOUNTER — Emergency Department (HOSPITAL_COMMUNITY)
Admission: EM | Admit: 2014-06-07 | Discharge: 2014-06-07 | Discharge status: Against Medical Advice | Payer: Self-pay | Attending: Emergency Medicine | Admitting: Emergency Medicine

## 2014-06-07 DIAGNOSIS — Z720 Tobacco use: Secondary | ICD-10-CM | POA: Insufficient documentation

## 2014-06-07 DIAGNOSIS — F1423 Cocaine dependence with withdrawal: Secondary | ICD-10-CM | POA: Insufficient documentation

## 2014-06-07 DIAGNOSIS — F1123 Opioid dependence with withdrawal: Secondary | ICD-10-CM | POA: Insufficient documentation

## 2014-06-07 DIAGNOSIS — J45909 Unspecified asthma, uncomplicated: Secondary | ICD-10-CM | POA: Insufficient documentation

## 2014-06-07 NOTE — ED Notes (Signed)
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 Last use this am. Currently c/o abd pain, HA, body aches. Pt informed that the ED no longer detox's opiates or ETOH. Pt upset with this. Sts "what if I said I was suicidal, what if I said I was going to go outside and blow my brains out, would I get help?!, I might as well go out there and kill myself if I don't get help." Pt sts she will wait to see the Dr and [facility]

## 2014-06-07 NOTE — ED Notes (Signed)
Pt not in triage room. No belongings in room. Pt was not noted to leave dept, but is not found within dept anywhere. Charge RN made aware. Pt will be d/c as LWBS after triage.

## 2014-06-07 NOTE — ED Notes (Signed)
Pt called for triage x 1, no answer. 

## 2015-01-25 ENCOUNTER — Encounter: Payer: Self-pay | Admitting: Physician Assistant

## 2015-01-25 ENCOUNTER — Ambulatory Visit (INDEPENDENT_AMBULATORY_CARE_PROVIDER_SITE_OTHER): Payer: Self-pay | Admitting: Physician Assistant

## 2015-01-25 VITALS — BP 110/76 | HR 92 | Temp 98.2°F | Resp 16 | Ht 63.0 in | Wt 103.0 lb

## 2015-01-25 DIAGNOSIS — L03119 Cellulitis of unspecified part of limb: Secondary | ICD-10-CM

## 2015-01-25 DIAGNOSIS — F1190 Opioid use, unspecified, uncomplicated: Secondary | ICD-10-CM

## 2015-01-25 DIAGNOSIS — F1110 Opioid abuse, uncomplicated: Secondary | ICD-10-CM

## 2015-01-25 DIAGNOSIS — M25522 Pain in left elbow: Secondary | ICD-10-CM

## 2015-01-25 MED ORDER — DOXYCYCLINE HYCLATE 100 MG PO TABS: 100.0000 mg | ORAL_TABLET | Freq: Two times a day (BID) | ORAL | Status: DC

## 2015-01-25 MED ORDER — DICLOFENAC SODIUM 75 MG PO TBEC: 75.0000 mg | DELAYED_RELEASE_TABLET | Freq: Two times a day (BID) | ORAL | Status: DC

## 2015-01-25 NOTE — Progress Notes (Signed)
   Eileen Torres  MRN: [id number] DOB: 17-Apr-1985  Subjective:  Pt presents to clinic with elbow pain and swelling and redness.  She popped a pus bump on her elbow a few days ago and then today she noticed the left elbow was swollen and erythematous and very painful.  She has done nothing for it today.  She does not feel good but she has not felt great since she stopped heroin about 2 weeks ago and she is not really sure if she started feeling worse over the last couple of days.  Right hand dominant.   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   No current outpatient prescriptions on file prior to visit.   No current facility-administered medications on file prior to visit.    No Known Allergies  Review of Systems  Constitutional: Positive for fever (subjective today). Negative for chills.  Skin: Positive for wound.   Objective:  BP 110/76 mmHg  Pulse 92  Temp(Src) 98.2 F (36.8 C) (Oral)  Resp 16  Ht  (1.6 m)  Wt 103 lb (46.72 kg)  BMI 18.25 kg/m2  SpO2 99%  LMP 01/18/2015 (Approximate)  Physical Exam  Constitutional: She is oriented to person, place, and time and well-developed, well-nourished, and in no distress.  HENT:  Head: Normocephalic and atraumatic.  Right Ear: Hearing and external ear normal.  Left Ear: Hearing and external ear normal.  Eyes: Conjunctivae are normal.  Neck: Normal range of motion.  Pulmonary/Chest: Effort normal.  Neurological: She is alert and oriented to person, place, and time. Gait normal.  Skin: Skin is warm and dry.     Psychiatric: Mood, memory, affect and judgment normal.  Vitals reviewed.   Assessment and Plan :  Cellulitis of elbow - Plan: doxycycline (VIBRA-TABS) 100 MG tablet, Care order/instruction  Pain, elbow, left - Plan: diclofenac (VOLTAREN) 75 MG EC tablet   Cellulitis on elbow - start with abx  and warm compresses - she will RTC tomorrow if worse (skin marker used to mark the erythema and the swelling) or 48h if it is not getting better RTC.  Encouraged pt to continue no heroin use and to let me know if she needed any help.  Benny Lennert PA-C  [facility] 01/25/2015 2:32 PM

## 2015-01-28 ENCOUNTER — Telehealth: Payer: Self-pay

## 2015-01-28 NOTE — Telephone Encounter (Signed)
Pt states she discussed with Maralyn Sago about her recovering from [facility] and really would like to speak with someone about what she can take. Please call [phone]

## 2015-01-28 NOTE — Telephone Encounter (Signed)
Spoke with pt, she wanted to see if she can something to help the withdrawals and cravings. She just does not want to try Methadone. She does not know her options. Can someone help her?

## 2015-01-28 NOTE — Telephone Encounter (Signed)
Left message for pt to call back.' WHich medications is she inquiring about?

## 2015-01-29 NOTE — Telephone Encounter (Signed)
If patient is not being seen by an outpatient rehab facility, she really needs to consider this. Naloxone and methadone are first line therapies but really require specialities given the difficulty in managing opioid abuse. Please have patient check with her insurance to see which rehab facilities are under her coverage.

## 2015-01-30 NOTE — Telephone Encounter (Signed)
Left message for pt to call back  °

## 2015-02-01 NOTE — Telephone Encounter (Signed)
lmom to cb. 
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
## 2015-08-16 ENCOUNTER — Encounter (HOSPITAL_COMMUNITY): Payer: Self-pay | Admitting: Emergency Medicine

## 2015-08-16 ENCOUNTER — Inpatient Hospital Stay (HOSPITAL_COMMUNITY)
Admission: EM | Admit: 2015-08-16 | Discharge: 2015-08-20 | DRG: 872 | Disposition: A | Discharge status: Home / Self Care | Payer: Medicaid Other | Attending: Family Medicine | Admitting: Family Medicine

## 2015-08-16 DIAGNOSIS — F17200 Nicotine dependence, unspecified, uncomplicated: Secondary | ICD-10-CM | POA: Diagnosis present

## 2015-08-16 DIAGNOSIS — N179 Acute kidney failure, unspecified: Secondary | ICD-10-CM | POA: Diagnosis present

## 2015-08-16 DIAGNOSIS — N12 Tubulo-interstitial nephritis, not specified as acute or chronic: Secondary | ICD-10-CM | POA: Diagnosis present

## 2015-08-16 DIAGNOSIS — E860 Dehydration: Secondary | ICD-10-CM | POA: Diagnosis present

## 2015-08-16 DIAGNOSIS — E876 Hypokalemia: Secondary | ICD-10-CM | POA: Diagnosis present

## 2015-08-16 DIAGNOSIS — F1190 Opioid use, unspecified, uncomplicated: Secondary | ICD-10-CM | POA: Diagnosis present

## 2015-08-16 DIAGNOSIS — F1410 Cocaine abuse, uncomplicated: Secondary | ICD-10-CM

## 2015-08-16 DIAGNOSIS — Z681 Body mass index (BMI) 19 or less, adult: Secondary | ICD-10-CM

## 2015-08-16 DIAGNOSIS — F329 Major depressive disorder, single episode, unspecified: Secondary | ICD-10-CM | POA: Diagnosis present

## 2015-08-16 DIAGNOSIS — F419 Anxiety disorder, unspecified: Secondary | ICD-10-CM | POA: Diagnosis present

## 2015-08-16 DIAGNOSIS — A419 Sepsis, unspecified organism: Secondary | ICD-10-CM | POA: Diagnosis present

## 2015-08-16 HISTORY — DX: Gastro-esophageal reflux disease without esophagitis: K21.9

## 2015-08-16 HISTORY — DX: Bipolar disorder, unspecified: F31.9

## 2015-08-16 HISTORY — DX: Unspecified asthma, uncomplicated: J45.909

## 2015-08-16 HISTORY — DX: Tubulo-interstitial nephritis, not specified as acute or chronic: N12

## 2015-08-16 LAB — COMPREHENSIVE METABOLIC PANEL
ALT: 12 U/L — ABNORMAL LOW (ref 14–54)
ANION GAP: 12 (ref 5–15)
AST: 17 U/L (ref 15–41)
Albumin: 3.5 g/dL (ref 3.5–5.0)
Alkaline Phosphatase: 89 U/L (ref 38–126)
BUN: 19 mg/dL (ref 6–20)
CALCIUM: 8.9 mg/dL (ref 8.9–10.3)
CO2: 25 mmol/L (ref 22–32)
CREATININE: 1.01 mg/dL — AB (ref 0.44–1.00)
Chloride: 97 mmol/L — ABNORMAL LOW (ref 101–111)
GFR calc Af Amer: >60 mL/min (ref >60)
GFR calc non Af Amer: >60 mL/min (ref >60)
Glucose, Bld: 154 mg/dL — ABNORMAL HIGH (ref 65–99)
Potassium: 3.2 mmol/L — ABNORMAL LOW (ref 3.5–5.1)
Sodium: 134 mmol/L — ABNORMAL LOW (ref 135–145)
TOTAL PROTEIN: 7.9 g/dL (ref 6.5–8.1)
Total Bilirubin: 0.9 mg/dL (ref 0.3–1.2)

## 2015-08-16 LAB — RAPID URINE DRUG SCREEN, HOSP PERFORMED
Amphetamines: NOT DETECTED
BARBITURATES: NOT DETECTED
Benzodiazepines: NOT DETECTED
COCAINE: POSITIVE — AB
Opiates: POSITIVE — AB
TETRAHYDROCANNABINOL: NOT DETECTED

## 2015-08-16 LAB — I-STAT CG4 LACTIC ACID, ED
Lactic Acid, Venous: 2.03 mmol/L (ref 0.5–1.9)
Lactic Acid, Venous: 0.8 mmol/L (ref 0.5–1.9)

## 2015-08-16 LAB — LIPASE, BLOOD: LIPASE: 15 U/L (ref 11–51)

## 2015-08-16 LAB — URINE MICROSCOPIC-ADD ON

## 2015-08-16 LAB — URINALYSIS, ROUTINE W REFLEX MICROSCOPIC
Bilirubin Urine: NEGATIVE
Glucose, UA: NEGATIVE mg/dL
KETONES UR: NEGATIVE mg/dL
Nitrite: NEGATIVE
PROTEIN: 100 mg/dL — AB
Specific Gravity, Urine: 1.019 (ref 1.005–1.030)
pH: 5.5 (ref 5.0–8.0)

## 2015-08-16 LAB — CBC
HCT: 40.7 % (ref 36.0–46.0)
Hemoglobin: 14 g/dL (ref 12.0–15.0)
MCH: 28.3 pg (ref 26.0–34.0)
MCHC: 34.4 g/dL (ref 30.0–36.0)
MCV: 82.4 fL (ref 78.0–100.0)
PLATELETS: 214 10*3/uL (ref 150–400)
RBC: 4.94 MIL/uL (ref 3.87–5.11)
RDW: 12.7 % (ref 11.5–15.5)
WBC: 25.3 10*3/uL — AB (ref 4.0–10.5)

## 2015-08-16 LAB — LACTIC ACID, PLASMA
Lactic Acid, Venous: 0.8 mmol/L (ref 0.5–1.9)
Lactic Acid, Venous: 1.2 mmol/L (ref 0.5–1.9)

## 2015-08-16 LAB — I-STAT BETA HCG BLOOD, ED (MC, WL, AP ONLY)

## 2015-08-16 LAB — CK: Total CK: 20 U/L — ABNORMAL LOW (ref 38–234)

## 2015-08-16 LAB — MRSA PCR SCREENING: MRSA BY PCR: NEGATIVE

## 2015-08-16 MED ORDER — MORPHINE SULFATE (PF) 4 MG/ML IV SOLN
3.0000 mg | INTRAVENOUS | Status: DC | PRN
  Administered 2015-08-16 – 2015-08-19 (×23): 3 mg via INTRAVENOUS
  Filled 2015-08-16 (×26): qty 1

## 2015-08-16 MED ORDER — SODIUM CHLORIDE 0.9 % IV SOLN
INTRAVENOUS | Status: DC
  Administered 2015-08-16: 23:00:00 via INTRAVENOUS
  Administered 2015-08-16 (×2): 1000 mL via INTRAVENOUS
  Administered 2015-08-17: 17:00:00 via INTRAVENOUS
  Administered 2015-08-18 (×2): 125 mL/h via INTRAVENOUS

## 2015-08-16 MED ORDER — DEXTROSE 5 % IV SOLN
2.0000 g | INTRAVENOUS | Status: DC
  Administered 2015-08-17 – 2015-08-18 (×2): 2 g via INTRAVENOUS
  Filled 2015-08-16 (×3): qty 2

## 2015-08-16 MED ORDER — VANCOMYCIN HCL 10 G IV SOLR
1250.0000 mg | Freq: Once | INTRAVENOUS | Status: AC
  Administered 2015-08-16: 1250 mg via INTRAVENOUS
  Filled 2015-08-16: qty 1250

## 2015-08-16 MED ORDER — ONDANSETRON HCL 4 MG/2ML IJ SOLN
4.0000 mg | Freq: Four times a day (QID) | INTRAMUSCULAR | Status: DC | PRN
  Administered 2015-08-16 – 2015-08-19 (×5): 4 mg via INTRAVENOUS
  Filled 2015-08-16 (×5): qty 2

## 2015-08-16 MED ORDER — ACETAMINOPHEN 650 MG RE SUPP: 650.0000 mg | Freq: Four times a day (QID) | RECTAL | Status: DC | PRN

## 2015-08-16 MED ORDER — DEXTROSE 5 % IV SOLN: 1.0000 g | INTRAVENOUS | Status: DC

## 2015-08-16 MED ORDER — DIAZEPAM 5 MG/ML IJ SOLN
5.0000 mg | INTRAMUSCULAR | Status: DC | PRN
  Administered 2015-08-16 – 2015-08-18 (×8): 5 mg via INTRAVENOUS
  Filled 2015-08-16 (×8): qty 2

## 2015-08-16 MED ORDER — DEXTROSE 5 % IV SOLN
2.0000 g | Freq: Once | INTRAVENOUS | Status: AC
  Administered 2015-08-16: 2 g via INTRAVENOUS
  Filled 2015-08-16: qty 2

## 2015-08-16 MED ORDER — PROMETHAZINE HCL 25 MG/ML IJ SOLN
12.5000 mg | INTRAMUSCULAR | Status: DC | PRN
  Administered 2015-08-16 – 2015-08-17 (×6): 12.5 mg via INTRAVENOUS
  Filled 2015-08-16 (×6): qty 1

## 2015-08-16 MED ORDER — ONDANSETRON HCL 4 MG PO TABS: 4.0000 mg | ORAL_TABLET | Freq: Four times a day (QID) | ORAL | Status: DC | PRN

## 2015-08-16 MED ORDER — ONDANSETRON 4 MG PO TBDP
ORAL_TABLET | ORAL | Status: AC
  Filled 2015-08-16: qty 1

## 2015-08-16 MED ORDER — VANCOMYCIN HCL IN DEXTROSE 750-5 MG/150ML-% IV SOLN
750.0000 mg | Freq: Two times a day (BID) | INTRAVENOUS | Status: DC
  Filled 2015-08-16: qty 150

## 2015-08-16 MED ORDER — ONDANSETRON 4 MG PO TBDP
4.0000 mg | ORAL_TABLET | Freq: Once | ORAL | Status: AC | PRN
  Administered 2015-08-16: 4 mg via ORAL

## 2015-08-16 MED ORDER — SODIUM CHLORIDE 0.9 % IV BOLUS (SEPSIS)
1000.0000 mL | Freq: Once | INTRAVENOUS | Status: AC
  Administered 2015-08-16: 1000 mL via INTRAVENOUS

## 2015-08-16 MED ORDER — ACETAMINOPHEN 325 MG PO TABS
650.0000 mg | ORAL_TABLET | Freq: Once | ORAL | Status: AC
  Administered 2015-08-16: 650 mg via ORAL
  Filled 2015-08-16: qty 2

## 2015-08-16 MED ORDER — NICOTINE 14 MG/24HR TD PT24
14.0000 mg | MEDICATED_PATCH | Freq: Every day | TRANSDERMAL | Status: DC
  Administered 2015-08-16 – 2015-08-18 (×3): 14 mg via TRANSDERMAL
  Filled 2015-08-16 (×3): qty 1

## 2015-08-16 MED ORDER — SODIUM CHLORIDE 0.9% FLUSH
3.0000 mL | Freq: Two times a day (BID) | INTRAVENOUS | Status: DC
  Administered 2015-08-16 – 2015-08-20 (×7): 3 mL via INTRAVENOUS

## 2015-08-16 MED ORDER — ENOXAPARIN SODIUM 40 MG/0.4ML ~~LOC~~ SOLN
40.0000 mg | SUBCUTANEOUS | Status: DC
  Administered 2015-08-16: 40 mg via SUBCUTANEOUS
  Filled 2015-08-16 (×2): qty 0.4

## 2015-08-16 MED ORDER — MORPHINE SULFATE (PF) 2 MG/ML IV SOLN
2.0000 mg | INTRAVENOUS | Status: DC | PRN
  Administered 2015-08-16: 2 mg via INTRAVENOUS
  Filled 2015-08-16: qty 1

## 2015-08-16 MED ORDER — ACETAMINOPHEN 325 MG PO TABS
650.0000 mg | ORAL_TABLET | Freq: Four times a day (QID) | ORAL | Status: DC | PRN
  Administered 2015-08-16 – 2015-08-18 (×5): 650 mg via ORAL
  Filled 2015-08-16 (×5): qty 2

## 2015-08-16 MED ORDER — VANCOMYCIN HCL IN DEXTROSE 750-5 MG/150ML-% IV SOLN
750.0000 mg | Freq: Two times a day (BID) | INTRAVENOUS | Status: DC
  Administered 2015-08-17: 750 mg via INTRAVENOUS
  Filled 2015-08-16 (×2): qty 150

## 2015-08-16 NOTE — ED Notes (Signed)
[facility] contacted to page Code Sepsis 

## 2015-08-16 NOTE — Progress Notes (Signed)
Patient trasfered from ED to [phone] via stretcher; alert and oriented x 4; complaints of pain in left flank radiating in right side; IV saline locked in RAC; skin intact. Orient patient to room and unit; gave patient care guide; fluids were started - NS@125cc /hr;  instructed how to use the call bell and  fall risk precautions. Will continue to monitor the patient.

## 2015-08-16 NOTE — Progress Notes (Signed)
Dear Doctor: Eileen Torres This patient has been identified as a candidate for PICC for the following reason (s): restarts due to phlebitis and infiltration in 24 hours If you agree, please write an order for the indicated device. I have assessed both arms with ultrasound and has very poor peripherall veins.  Obtain one PIV in bend of arm.  Will be very difficult to get another IV. For any questions contact the Vascular Access Team at [phone] if no answer, please leave a message.  Thank you for supporting the early vascular access assessment program.

## 2015-08-16 NOTE — ED Provider Notes (Signed)
Medical screening examination/treatment/procedure(s) were conducted as a shared visit with non-physician practitioner(s) and myself.  I personally evaluated the patient during the encounter.   EKG Interpretation  Date/Time:  Sunday August 16 2015 13:05:24 EDT Ventricular Rate:  121 PR Interval:    QRS Duration: 85 QT Interval:  378 QTC Calculation: 537 R Axis:   87 Text Interpretation:  Sinus tachycardia Repol abnrm suggests ischemia, inferior leads Prolonged QT interval No significant change since last tracing Confirmed by Davarious Tumbleson  MD, Lariza Cothron ([id number]) on 08/16/2015 2:08:45 PM     30  year old female here complaining of back pain with fever and chills. Urinalysis consistent with polyp nephritis. Medicated here will be admitted for observation   Lorre NickAnthony Tessie Ordaz, MD 08/16/15 1427

## 2015-08-16 NOTE — H&P (Signed)
Family Medicine Teaching [facility] Admission History and Physical Service Pager: [phone]  Patient name: Eileen Torres Medical record number: [id number] Date of birth: 1985/04/22 Age: 30 y.o. Gender: female  Primary Care Provider: No primary care provider on file. Consultants: None Code Status: FULL  Chief Complaint: urinary symptoms and back pain  Assessment and Plan: Eileen Torres is a 30 y.o. female presenting with urinary symptoms x 1 week and back/flank pain. PMH is significant for IVDA, anxiety, asthma, depression. istat hCG negative. Zofran and 2L given in ED. Blood and urine cultures were taken and pt started on Rocephin.   #Sepsis likely 2/2 to Pyelonephritis - Tachycardic to 145 and febrile to 102.3 in ED meeting SIRS criteria. Qsofa score 0 at admission. Initial Lactic acid 2.01, wbc count 25,000. UA shows few bacteria, large hgb, mod LE, negative nitrite, many WBC and 6-30 squamous cells. Patient reporting UTI symptoms at admission and with +left CVA tenderness.  -Admit to tele for obs, attending Dr. Lum Babe - Continue Rocephin 2g IV daily as started in ED -consider bacteremia given hx IVDA. Vancomycin added to cover for possible Staph -consider PID given young sexually active female. -urine GC Chlamydia ordered -IV Morphine  Q2h prn for pain control -po Zofran  Q6h prn for nausea/vomiting -Tylenol prn -monitor for fevers overnight -s/p 2L IV bolus in ED -IV NS 125 mL/hr @ maintenance -clear liquid diet, advance diet as tolerated -Vitals as per unit routine  #History of recent heroine use/IVDA - UDS in ED +for opiates and cocaine. -supportive care -monitor for signs/symptoms of withdrawal -counseling for substance abuse -SW consult  #AKI: Most likely secondary to dehydration in the setting of nausea/vomiting. Positive Hgb but negative RBCs in UA, consider rhabdo -CK ordered  -IVFs as above  -BMET in AM   #asthma: patient does not take any home  meds. No respiratory symptoms on admission.   #depression/anxiety: patient does not take any home meds   FEN/GI: Clear liquid diet Prophylaxis: Lovenox  Disposition: Admit to family medicine teaching service for IV antibiotics, fluids and pain control.  History of Present Illness:  Eileen Torres is a 30 y.o. female presenting with urinary symptoms that began a week ago when she started having some burning on urination. She states her "kidneys" started hurting. She was also having hesitancy and frequency with her dysuria. Took some Amoxicillin for a couple days (she had an Rx from the dentist) and felt better but then ran out of medication.  Has been febrile at home with Tmax 104 today.  Nausea/vomiting x past 3 days. Denies abdominal pain. Endorses body aches and chills.   Denies vaginal discharge. She is sexually active and has unprotected sex with one partner. No history of STDs; had a negative STD check in April. Denies pelvic pain, abnormal vaginal bleeding, chest pain and shortness of breath.   Patient is also an IV drug user. States that she last used heroin this morning when she injected it into her hand.   After receiving IV abx and IVF in the ED, states that she already feels much better and was able to urinate normally.   Review Of Systems: Per HPI.  Otherwise the remainder of the systems were negative.  Patient Active Problem List   Diagnosis Date Noted  . Pyelonephritis   . Sepsis (HCC)   . Heroin user 01/25/2015    Past Medical History: Past Medical History:  Diagnosis Date  . Anxiety   . Asthma   .  Depression   . Encounter for Essure implantation 09/2011  . HSV (herpes simplex virus) infection 2009   ?outbreak could be HSV last pregnancy-not definate diagnosis per pt and chart- pt on Valtrex prophylacttcally  . Migraines     Past Surgical History: Past Surgical History:  Procedure Laterality Date  . NO PAST SURGERIES      Social History:  Social  History  Substance Use Topics  . Smoking status: Current Every Day Smoker    Packs/day: 0.25    Years: 12.00    Types: Cigarettes    Last attempt to quit: 09/27/2013  . Smokeless tobacco: Not on file  . Alcohol use No    Family History: Family History  Problem Relation Age of Onset  . Cancer Other   . Diabetes Other   . Heart failure Other   . Stroke Other     Allergies and Medications: No Known Allergies No current facility-administered medications on file prior to encounter.    No current outpatient prescriptions on file prior to encounter.    Objective: BP 113/67   Pulse 90   Temp 98.8 F (37.1 C) (Oral)   Resp 11   Ht 5\' 3"  (1.6 m)   Wt 110 lb (49.9 kg)   SpO2 98%   BMI 19.49 kg/m  Exam: General: alert, awake thin female laying in bed in NAD Eyes: PERRL, EOMI ENTM: no rhinorrhea, mucous membranes moist Neck: supple no LAD Cardiovascular: RRR, no murmurs appreciated on exam Respiratory: CTA B/L no wheezing noted Abdomen: soft, NT ND, +CVA tenderness L>R MSK: FROM x4 Skin: no rashes noted, needle tracks in right hand Neuro: grossly intact, no focal deficits Psych: mood appropriate  Labs and Imaging: CBC BMET   Recent Labs Lab 08/16/15 1251  WBC 25.3*  HGB 14.0  HCT 40.7  PLT 214    Recent Labs Lab 08/16/15 1251  NA 134*  K 3.2*  CL 97*  CO2 25  BUN 19  CREATININE 1.01*  GLUCOSE 154*  CALCIUM 8.9      Freddrick MarchYashika Amin, MD 08/16/2015, 4:38 PM PGY-1, [facility] FPTS Intern pager: [phone], text pages welcome   Upper Level Addendum:  I have seen and evaluated this patient along with Dr. Nelson ChimesAmin and reviewed the above note, making necessary revisions in green.   Marcy Sirenatherine Clothilde Tippetts, D.O. 08/16/2015, 7:24 PM PGY-2, [facility]

## 2015-08-16 NOTE — Progress Notes (Signed)
Pharmacy Antibiotic Note  Eileen Torres is a 30 y.o. female admitted on 08/16/2015 with sepsis.  Pharmacy has been consulted for vanc/ctx dosing. Patient presented with fever and chills and UA consistent with pyelonephritis.   Plan: Vancomycin load 1250 mg IV X 1  Vancomycin 750 IV every 12 hours.  Goal trough 15-20 mcg/mL.  [facility]. Ceftriaxone 2 gm IV Q 24 H F/U cultures, clinical signs/symptoms, LOT  Height: 5\' 3"  (160 cm) Weight: 125 lb 3.5 oz (56.8 kg) IBW/kg (Calculated) : 52.4  Temp (24hrs), Avg:99.5 F (37.5 C), Min:98.3 F (36.8 C), Max:102.3 F (39.1 C)   Recent Labs Lab 08/16/15 1246 08/16/15 1251 08/16/15 1632  WBC  --  25.3*  --   CREATININE  --  1.01*  --   LATICACIDVEN 2.03*  --  0.80    Estimated Creatinine Clearance: 66.1 mL/min (by C-G formula based on SCr of 1.01 mg/dL).    No Known Allergies  Antimicrobials this admission: CTX 8/13>>  Vanc 8/13 >>    Microbiology results:  8/13 BCx: Pending  8/13 UCx: Pending    Thank you for allowing pharmacy to be a part of this patient's care.  Hillis Rangemily A Stewart 08/16/2015 5:10 PM

## 2015-08-16 NOTE — ED Provider Notes (Signed)
[facility]-EMERGENCY DEPT Provider Note   CSN: [id number] Arrival date & time: 08/16/15  1203  First Provider Contact:  First MD Initiated Contact with Patient 08/16/15 1222        History   Chief Complaint Chief Complaint  Patient presents with  . Back Pain  . Emesis    HPI Eileen Torres is a 30 y.o. female.  Eileen Torres is a 30 y.o. Female who presents the emergency carmine complaining of urinary symptoms, left flank pain and fevers for the past week. Patient reports that one week ago her symptoms began with dysuria, urinary frequency and urinary urgency. She reports 2 days ago she began having left flank pain that can sometimes radiate to her right side. She reports she's had pyelonephritis in the past and this feels similar. She reports her pain is constant. No colicky pain. She's had nothing for treatment of her symptoms prior to her arrival today. She reports having some nausea and has vomited several times today. She reports feeling better after Zofran that she received after arrival to the emergency department. She denies having any abdominal pain. Last menstrual cycle was 2 weeks ago. Patient reports she last used IV drugs one month ago. Patient denies abdominal pain, hemoptysis, hematochezia, diarrhea, hematuria, chest pain, shortness of breath, palpitations, vaginal bleeding, vaginal discharge or rashes.   The history is provided by the patient and medical records. No language interpreter was used.    Past Medical History:  Diagnosis Date  . Anxiety   . Asthma   . Depression   . Encounter for Essure implantation 09/2011  . HSV (herpes simplex virus) infection 2009   ?outbreak could be HSV last pregnancy-not definate diagnosis per pt and chart- pt on Valtrex prophylacttcally  . Migraines     Patient Active Problem List   Diagnosis Date Noted  . 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 NO PAST SURGERIES      OB History     Gravida Para Term Preterm AB Living   2 2 1 1   2    SAB TAB Ectopic Multiple Live Births           1       Home Medications    Prior to Admission medications   Not on File    Family History Family History  Problem Relation Age of Onset  . Cancer Other   . Diabetes Other   . Heart failure Other   . Stroke Other     Social History Social History  Substance Use Topics  . Smoking status: Current Every Day Smoker    Packs/day: 0.25    Years: 12.00    Types: Cigarettes    Last attempt to quit: 09/27/2013  . Smokeless tobacco: Not on file  . Alcohol use No     Allergies   Review of patient's allergies indicates no known allergies.   Review of Systems Review of Systems  Constitutional: Positive for chills, fatigue and fever.  HENT: Negative for congestion and sore throat.   Eyes: Negative for visual disturbance.  Respiratory: Negative for cough and shortness of breath.   Cardiovascular: Negative for chest pain.  Gastrointestinal: Positive for nausea and vomiting. Negative for abdominal pain, blood in stool and diarrhea.  Genitourinary: Positive for dysuria, flank pain, frequency and urgency. Negative for decreased urine volume, difficulty urinating, genital sores, hematuria, menstrual problem, pelvic pain, vaginal bleeding and vaginal discharge.  Musculoskeletal: Positive  for back pain and myalgias. Negative for neck pain.  Skin: Negative for rash and wound.  Neurological: Negative for syncope and headaches.     Physical Exam Updated Vital Signs BP 105/76   Pulse 100   Temp 98.3 F (36.8 C) (Oral)   Resp 17   Ht  (1.6 m)   Wt 49.9 kg   SpO2 99%   BMI 19.49 kg/m   Physical Exam  Constitutional: She appears well-developed and well-nourished. No distress.  HENT:  Head: Normocephalic and atraumatic.  Mouth/Throat: Oropharynx is clear and moist.  Eyes: Conjunctivae are normal. Pupils are equal, round, and reactive to light. Right eye exhibits no  discharge. Left eye exhibits no discharge.  Neck: Neck supple.  Cardiovascular: Normal rate, regular rhythm, normal heart sounds and intact distal pulses.  Exam reveals no gallop and no friction rub.   No murmur heard. Heart rate is 124.  Pulmonary/Chest: Effort normal and breath sounds normal. No respiratory distress. She has no wheezes. She has no rales.  Lungs clear to auscultation bilaterally.  Abdominal: Soft. Bowel sounds are normal. She exhibits no distension and no mass. There is no tenderness. There is no guarding.  Abdomen is soft and nontender to palpation. Bowel sounds are present. Patient has left CVA tenderness palpation.  Musculoskeletal: Normal range of motion. She exhibits no edema, tenderness or deformity.  Good strength to her bilateral upper and lower extremities. No midline neck or back tenderness. No back erythema, deformity, ecchymosis or warmth.  Lymphadenopathy:    She has no cervical adenopathy.  Neurological: She is alert. Coordination normal.  Skin: Skin is warm and dry. Capillary refill takes less than 2 seconds. No rash noted. She is not diaphoretic. No erythema. No pallor.  Psychiatric: She has a normal mood and affect. Her behavior is normal.  Nursing note and vitals reviewed.    ED Treatments / Results  Labs (all labs ordered are listed, but only abnormal results are displayed) Labs Reviewed  COMPREHENSIVE METABOLIC PANEL - Abnormal; Notable for the following:       Result Value   Sodium 134 (*)    Potassium 3.2 (*)    Chloride 97 (*)    Glucose, Bld 154 (*)    Creatinine, Ser 1.01 (*)    ALT 12 (*)    All other components within normal limits  CBC - Abnormal; Notable for the following:    WBC 25.3 (*)    All other components within normal limits  URINALYSIS, ROUTINE W REFLEX MICROSCOPIC (NOT AT [facility]) - Abnormal; Notable for the following:    Color, Urine AMBER (*)    APPearance TURBID (*)    Hgb urine dipstick LARGE (*)    Protein, ur 100 (*)     Leukocytes, UA MODERATE (*)    All other components within normal limits  URINE MICROSCOPIC-ADD ON - Abnormal; Notable for the following:    Squamous Epithelial / LPF 6-30 (*)    Bacteria, UA FEW (*)    All other components within normal limits  I-STAT CG4 LACTIC ACID, ED - Abnormal; Notable for the following:    Lactic Acid, Venous 2.03 (*)    All other components within normal limits  CULTURE, BLOOD (ROUTINE X 2)  CULTURE, BLOOD (ROUTINE X 2)  URINE CULTURE  LIPASE, BLOOD  URINE RAPID DRUG SCREEN, HOSP PERFORMED  I-STAT BETA HCG BLOOD, ED ([facility], [facility], [facility] ONLY)  I-STAT CG4 LACTIC ACID, ED    EKG  EKG Interpretation  Date/Time:  Sunday August 16 2015 13:05:24 EDT Ventricular Rate:  121 PR Interval:    QRS Duration: 85 QT Interval:  378 QTC Calculation: 537 R Axis:   87 Text Interpretation:  Sinus tachycardia Repol abnrm suggests ischemia, inferior leads Prolonged QT interval No significant change since last tracing Confirmed by ALLEN  MD, ANTHONY ([id number]) on 08/16/2015 2:08:45 PM       Radiology No results found.  Procedures Procedures (including critical care time)  Medications Ordered in ED Medications  cefTRIAXone (ROCEPHIN) 1 g in dextrose 5 % 50 mL IVPB (not administered)  ondansetron (ZOFRAN-ODT) disintegrating tablet 4 mg (4 mg Oral Given 08/16/15 1210)  cefTRIAXone (ROCEPHIN) 2 g in dextrose 5 % 50 mL IVPB (0 g Intravenous Stopped 08/16/15 1350)  sodium chloride 0.9 % bolus 1,000 mL (1,000 mLs Intravenous New Bag/Given 08/16/15 1319)  acetaminophen (TYLENOL) tablet 650 mg (650 mg Oral Given 08/16/15 1321)  sodium chloride 0.9 % bolus 1,000 mL (1,000 mLs Intravenous New Bag/Given 08/16/15 1359)     Initial Impression / Assessment and Plan / ED Course  I have reviewed the triage vital signs and the nursing notes.  Pertinent labs & imaging results that were available during my care of the patient were reviewed by me and considered in my medical decision making (see  chart for details).  Clinical Course  Comment By Time  Went to bedside and patient not in room yet  Everlene FarrierWilliam Adell Panek, PA-C 08/13 1222  Repeat sepsis assessment completed.  Everlene FarrierWilliam Zyad Boomer, PA-C 08/13 1400  Patient feeling better. HR is 114. BP 110/60. Plan for admission for pyelonephritis.  Everlene FarrierWilliam Jimmey Hengel, PA-C 08/13 1401   This is a 30 y.o. Female who presents the emergency carmine complaining of urinary symptoms, left flank pain and fevers for the past week. Patient reports that one week ago her symptoms began with dysuria, urinary frequency and urinary urgency. She reports 2 days ago she began having left flank pain that can sometimes radiate to her right side. She reports she's had pyelonephritis in the past and this feels similar. She reports her pain is constant. No colicky pain. She's had nothing for treatment of her symptoms prior to her arrival today. She reports having some nausea and has vomited several times today. She reports feeling better after Zofran that she received after arrival to the emergency department. She denies having any abdominal pain. Last menstrual cycle was 2 weeks ago. Patient reports she last used IV drugs one month ago. Later, She reports she actually used IV heroin yesterday. I advised the admitting team of this. On arrival to the emergency department the patient is febrile to 102 and tachycardia with a heart rate of 145. She has left CVA tenderness. Abdomen is soft and nontender. Urinalysis shows moderate leukocytes with too numerous to count white blood cells. Urine sent for culture. CMP reveals a creatinine of 1.01. CBC reveals a white count of 25,000. Pregnancy test is negative. Initial lactic acid is 2.03. Patient does not meet criteria for severe sepsis. Patient received 2 L fluid bolus and 2 g of Rocephin for pyelonephritis.  At recheck patient reports she is feeling much better. Heart rate is improved to 114. She does reveal that she last used heroin yesterday. I  advised the admitting team of this. I spoke with family medicine resident he accepted the patient for admission and requested temporary orders for telemetry obs. Patient is in agreement with admission.    This patient was discussed with and evaluated  by Dr. Freida Busman who agrees with assessment and plan.    Final Clinical Impressions(s) / ED Diagnoses   Final diagnoses:  Pyelonephritis    New Prescriptions New Prescriptions   No medications on file     Everlene Farrier, PA-C 08/16/15 1512

## 2015-08-16 NOTE — ED Triage Notes (Signed)
Pt here for fever, back pain and N/V; pt sts treating self with amoxicillin last week but not feeling better; pt sts body aches

## 2015-08-17 DIAGNOSIS — F1110 Opioid abuse, uncomplicated: Secondary | ICD-10-CM

## 2015-08-17 DIAGNOSIS — F1410 Cocaine abuse, uncomplicated: Secondary | ICD-10-CM

## 2015-08-17 LAB — CBC
HCT: 33.1 % — ABNORMAL LOW (ref 36.0–46.0)
HEMOGLOBIN: 10.9 g/dL — AB (ref 12.0–15.0)
MCH: 27.3 pg (ref 26.0–34.0)
MCHC: 32.9 g/dL (ref 30.0–36.0)
MCV: 83 fL (ref 78.0–100.0)
PLATELETS: 158 10*3/uL (ref 150–400)
RBC: 3.99 MIL/uL (ref 3.87–5.11)
RDW: 12.9 % (ref 11.5–15.5)
WBC: 16.6 10*3/uL — AB (ref 4.0–10.5)

## 2015-08-17 LAB — BASIC METABOLIC PANEL
ANION GAP: 10 (ref 5–15)
Anion gap: 15 (ref 5–15)
BUN: 12 mg/dL (ref 6–20)
BUN: 10 mg/dL (ref 6–20)
CALCIUM: 8.3 mg/dL — AB (ref 8.9–10.3)
CHLORIDE: 102 mmol/L (ref 101–111)
CHLORIDE: 102 mmol/L (ref 101–111)
CO2: 23 mmol/L (ref 22–32)
CO2: 16 mmol/L — AB (ref 22–32)
CREATININE: 0.67 mg/dL (ref 0.44–1.00)
Calcium: 8 mg/dL — ABNORMAL LOW (ref 8.9–10.3)
Creatinine, Ser: 0.7 mg/dL (ref 0.44–1.00)
GFR calc Af Amer: >60 mL/min (ref >60)
GFR calc non Af Amer: >60 mL/min (ref >60)
GLUCOSE: 117 mg/dL — AB (ref 65–99)
GLUCOSE: 94 mg/dL (ref 65–99)
POTASSIUM: 2.5 mmol/L — AB (ref 3.5–5.1)
Potassium: 4.5 mmol/L (ref 3.5–5.1)
Sodium: 135 mmol/L (ref 135–145)
Sodium: 133 mmol/L — ABNORMAL LOW (ref 135–145)

## 2015-08-17 LAB — GC/CHLAMYDIA PROBE AMP (~~LOC~~) NOT AT ARMC
CHLAMYDIA, DNA PROBE: NEGATIVE
Neisseria Gonorrhea: NEGATIVE

## 2015-08-17 MED ORDER — SODIUM CHLORIDE 0.9 % IV BOLUS (SEPSIS)
1000.0000 mL | Freq: Once | INTRAVENOUS | Status: AC
  Administered 2015-08-17: 1000 mL via INTRAVENOUS

## 2015-08-17 MED ORDER — CLONIDINE HCL 0.1 MG PO TABS
0.1000 mg | ORAL_TABLET | Freq: Three times a day (TID) | ORAL | Status: DC
  Administered 2015-08-17 – 2015-08-18 (×4): 0.1 mg via ORAL
  Filled 2015-08-17 (×5): qty 1

## 2015-08-17 MED ORDER — POTASSIUM CHLORIDE 10 MEQ/100ML IV SOLN
10.0000 meq | INTRAVENOUS | Status: AC
  Administered 2015-08-17 (×5): 10 meq via INTRAVENOUS
  Filled 2015-08-17 (×5): qty 100

## 2015-08-17 MED ORDER — ALUM & MAG HYDROXIDE-SIMETH 200-200-20 MG/5ML PO SUSP
30.0000 mL | Freq: Four times a day (QID) | ORAL | Status: DC | PRN
  Administered 2015-08-17 (×2): 30 mL via ORAL
  Filled 2015-08-17 (×2): qty 30

## 2015-08-17 MED ORDER — LOPERAMIDE HCL 2 MG PO CAPS
2.0000 mg | ORAL_CAPSULE | ORAL | Status: DC | PRN
  Administered 2015-08-17 – 2015-08-18 (×2): 2 mg via ORAL
  Filled 2015-08-17 (×3): qty 1

## 2015-08-17 MED ORDER — PROMETHAZINE HCL 25 MG/ML IJ SOLN
12.5000 mg | Freq: Four times a day (QID) | INTRAMUSCULAR | Status: DC | PRN
  Administered 2015-08-19: 12.5 mg via INTRAVENOUS
  Filled 2015-08-17: qty 1

## 2015-08-17 NOTE — Progress Notes (Signed)
Family Medicine Teaching Service Daily Progress Note Intern Pager: [phone]  Patient name: Eileen Torres Medical record number: [id number] Date of birth: 1985-05-28 Age: 30 y.o. Gender: female  Primary Care Provider: No primary care provider on file. Consultants: None  Code Status: FULL  Pt Overview and Major Events to Date:  Eileen Torres is a 30 y.o. female presenting with urinary symptoms x 1 week and back/flank pain. PMH is significant for IVDA, anxiety, asthma, depression. istat hCG negative. Zofran and 2L given in ED. Blood and urine cultures were taken and pt started on Rocephin.   Assessment and Plan:  #Sepsis likely 2/2 to Pyelonephritis - Patient met SIRS criteria with tachycardia and fever in the ED (on admission Qsofa 0), with CVA tenderness and UTI symptoms, UA with +LE and CVA tenderness so pyelonephritis likely source for sepsis.   Leukocytosis trend improved 25.3>>16.6 today.   Patient with fever to 103F overnight, improved this morning. - Continue Rocephin 2g IV daily (Started 8/13, currently day #2) - Discontinued Vancomycin as staph coverage; MRSA infection unlikely.  If patient clinically worsens may restart vanc given recent IVDA - follow up cultures and narrow antibiotics based on sensitivities - follow up urine GC chlamydia for PID rule-out - IV Morphine 3 mg q2h PRN for pain control -Tylenol prn -monitor fever curve -IV NS 125 mL/hr @ maintenance   #History of recent heroine use/IVDA - UDS in ED +for opiates and cocaine. Patient with tremulousness, anxiety and nausea overnight consistent with previous withdrawal symptoms.  - added Phenergan, zofran PRN for nausea - added diazepam 5 mg q4h PRN for anxiety/muscle soreness - imodium PRN diarrhea -counseling for substance abuse -SW consult, patient interested in heroin cessation   #AKI: Improved. Creatinine improved today at 0.7 , appears to be at baseline. Bmet trend. AKI was likely due to dehydration  from recent nausea/vomiting with pyelonephritis. Positive Hgb but negative RBCs in UA, consider rhabdo. - CK low at 20 U/L - continue IVF's as above - f/u BMP's  Hypokalemia - 2.5, repleted.  Follow up Bmet this afternoon.  #asthma: patient does not take any home meds. Patient without respiratory symptoms since admission.   #depression/anxiety: patient does not take any home meds    FEN/GI: Clear liquid diet, advance as tolerated PPx: Lovenox  Disposition: Discharge home pending clinical improvement  Subjective:  Patient lies in bed, diaphoretic.  Complains of anxiety and diarrhea.  Denies urinary symptoms.  Objective: Temp:  [98.3 F (36.8 C)-103 F (39.4 C)] 99.8 F (37.7 C) (08/14 0426) Pulse Rate:  [87-145] 94 (08/14 0426) Resp:  [10-19] 18 (08/14 0426) BP: (95-130)/(58-77) 120/74 (08/14 1050) SpO2:  [95 %-100 %] 99 % (08/14 0426) Weight:  [110 lb (49.9 kg)-125 lb 3.5 oz (56.8 kg)] 125 lb 3.5 oz (56.8 kg) (08/13 1648) Physical Exam: General: Diaphoretic female appears stated age, in no acute distress Cardiovascular: RRR, m/r/g Respiratory: CTA bilaterally, no W/R/R Abdomen: Soft, nontender, nondistended, normoactive BS GU: +L-sided CVA tenderness Extremities: Full ROM in 4 extremities, no LE edema bilaterally  Laboratory:  Recent Labs Lab 08/16/15 1251 08/17/15 0706  WBC 25.3* 16.6*  HGB 14.0 10.9*  HCT 40.7 33.1*  PLT 214 158    Recent Labs Lab 08/16/15 1251 08/17/15 0706  NA 134* 135  K 3.2* 2.5*  CL 97* 102  CO2 25 23  BUN 19 12  CREATININE 1.01* 0.70  CALCIUM 8.9 8.0*  PROT 7.9  --   BILITOT 0.9  --   [facility]  89  --   ALT 12*  --   AST 17  --   GLUCOSE 154* 117*      Imaging/Diagnostic Tests: No results found.   Everrett Coombe, MD 08/17/2015, 11:52 AM PGY-1, [place] Intern pager: [phone], text pages welcome

## 2015-08-17 NOTE — Progress Notes (Addendum)
Patient's K 4.5 and on run 5/6 bags of potassium. MD paged because patient complaining of potassium "hurting" running through IV. Clarifying if patient requires 6th run of potassium. MD verbal order to not give last bag.

## 2015-08-17 NOTE — Progress Notes (Signed)
Patient asking when she can eat food. RN saw patient eating guest's macaroni and cheese and chicken sandwich. Patient has tolerated food which she had over an hour ago. Patient denies nausea. Patient has not have any emesis today. MD stated she will also look at PRN medications.

## 2015-08-18 ENCOUNTER — Encounter (HOSPITAL_COMMUNITY): Payer: Self-pay | Admitting: *Deleted

## 2015-08-18 LAB — BASIC METABOLIC PANEL
Anion gap: 9 (ref 5–15)
BUN: 10 mg/dL (ref 6–20)
CHLORIDE: 105 mmol/L (ref 101–111)
CO2: 20 mmol/L — ABNORMAL LOW (ref 22–32)
Calcium: 7.8 mg/dL — ABNORMAL LOW (ref 8.9–10.3)
Creatinine, Ser: 0.88 mg/dL (ref 0.44–1.00)
Glucose, Bld: 129 mg/dL — ABNORMAL HIGH (ref 65–99)
POTASSIUM: 3 mmol/L — AB (ref 3.5–5.1)
SODIUM: 134 mmol/L — AB (ref 135–145)

## 2015-08-18 LAB — CBC
HEMATOCRIT: 29.4 % — AB (ref 36.0–46.0)
Hemoglobin: 9.9 g/dL — ABNORMAL LOW (ref 12.0–15.0)
MCH: 27.6 pg (ref 26.0–34.0)
MCHC: 33.7 g/dL (ref 30.0–36.0)
MCV: 81.9 fL (ref 78.0–100.0)
PLATELETS: 159 10*3/uL (ref 150–400)
RBC: 3.59 MIL/uL — AB (ref 3.87–5.11)
RDW: 13.2 % (ref 11.5–15.5)
WBC: 12.2 10*3/uL — AB (ref 4.0–10.5)

## 2015-08-18 LAB — URINE CULTURE

## 2015-08-18 MED ORDER — NICOTINE 14 MG/24HR TD PT24
14.0000 mg | MEDICATED_PATCH | Freq: Every day | TRANSDERMAL | Status: DC
  Administered 2015-08-18 – 2015-08-20 (×3): 14 mg via TRANSDERMAL
  Filled 2015-08-18 (×3): qty 1

## 2015-08-18 MED ORDER — POTASSIUM CHLORIDE CRYS ER 20 MEQ PO TBCR
80.0000 meq | EXTENDED_RELEASE_TABLET | Freq: Once | ORAL | Status: AC
  Administered 2015-08-18: 80 meq via ORAL
  Filled 2015-08-18: qty 4

## 2015-08-18 MED ORDER — DIAZEPAM 5 MG PO TABS
5.0000 mg | ORAL_TABLET | Freq: Four times a day (QID) | ORAL | Status: DC | PRN
  Administered 2015-08-18 – 2015-08-20 (×7): 5 mg via ORAL
  Filled 2015-08-18 (×7): qty 1

## 2015-08-18 NOTE — Progress Notes (Signed)
Family Medicine Teaching Service Daily Progress Note Intern Pager: [phone]  Patient name: Annia BeltCourtney L Coote Medical record number: [id number] Date of birth: 04/05/1985 Age: 30 y.o. Gender: female  Primary Care Provider: No primary care provider on file. Consultants: None  Code Status: FULL  Pt Overview and Major Events to Date:  Frederik SchmidtCourtney L Gurkinis a 30 y.o.femalepresenting with urinary symptoms x 1 week and back/flank pain. PMH is significant for IVDA, anxiety, asthma, depression. istat hCG negative. Zofran and 2L given in ED. Blood and urine cultures were taken and pt started on Rocephin.   Assessment and Plan: #Sepsis likely 2/2 to Pyelonephritis -  Patient febrile to 102.65F this morning. Leukocytosis trend 25.3>16.6>12.2.   - blood culture 8/13: NG for one day - urine culture 8/13: 30k colonies/mL E.coli, pan-sensitive - GC/chlamydia not collected, will follow up - pain control with tylenol, IV morphine 3 mg q2h PRN pain control - monitor fever curve -IV NS125 mL/hr @ maintenance discontinued; patient now tolerating PO  #History of recent heroine use/IVDA - UDS in ED +for opiates and cocaine and patient admits to heroin use the morning of admission. Tremulousness, anxiety, nausea improved this morning and patient has been tolerating PO.  Continue supportive care. - continue Phenergan PRN, zofran PRN for nausea - continue diazepam 5 mg q4h PRN for anxiety/muscle soreness - continue clonidine 0.1 mg tablet for withdrawal - continue imodium PRN diarrhea - counseling for substance abuse - Social work has been consulted; patient interested in heroin cessation.  Will follow up social work recommendations.   #AKI: Improved. Creatinine 0.88,  Likely now at baseline. Bmet trend. AKI was likely due to dehydration from recent nausea/vomiting with pyelonephritis. Positive Hgb but negative RBCs in UA, consider rhabdo. - CK low at 20 U/L - continue IVF's as above - f/u  BMP's  Hypokalemia - Improved.  Potassium found to be low yesterday at 2.5, was repleted and improved to 4.5 on repeat test.  This morning hypokalemic at 3.0.  Will replete and follow up with AM BMP.   - follow up AM BMP  #asthma: patient does not take any home meds. Patient without respiratory symptoms since admission.   #depression/anxiety: patient does not take any home meds  FEN/GI: normal diet, patient now tolerating PO PPx: Lovenox  Disposition: Discharge home pending SW consult and medical improvement  Subjective:  Patient rests comfortably in bed, diaphoretic, states she feels febrile  Objective: Temp:  [98.4 F (36.9 C)-102.7 F (39.3 C)] 100.4 F (38 C) (08/15 0640) Pulse Rate:  [73-95] 95 (08/15 0521) Resp:  [16-20] 16 (08/15 0521) BP: (90-120)/(55-74) 99/64 (08/15 0521) SpO2:  [98 %-100 %] 98 % (08/15 0521) Physical Exam: General: NAD, rests comfortably in bed Cardiovascular: RRR, no m/r/g Respiratory: CTA bilaterally, no W/R/R Abdomen: soft, nontender, nondistended, no hepatosplenomegally GU: +L-sided CVA tenderness Extremities: no LE edema bilaterally  Laboratory:  Recent Labs Lab 08/16/15 1251 08/17/15 0706 08/18/15 0538  WBC 25.3* 16.6* 12.2*  HGB 14.0 10.9* 9.9*  HCT 40.7 33.1* 29.4*  PLT 214 158 159    Recent Labs Lab 08/16/15 1251 08/17/15 0706 08/17/15 1504 08/18/15 0538  NA 134* 135 133* 134*  K 3.2* 2.5* 4.5 3.0*  CL 97* 102 102 105  CO2 25 23 16* 20*  BUN 19 12 10 10   CREATININE 1.01* 0.70 0.67 0.88  CALCIUM 8.9 8.0* 8.3* 7.8*  PROT 7.9  --   --   --   BILITOT 0.9  --   --   --  ALKPHOS 89  --   --   --   ALT 12*  --   --   --   AST 17  --   --   --   GLUCOSE 154* 117* 94 129*      Imaging/Diagnostic Tests: No results found.   Howard PouchLauren Ormand Senn, MD 08/18/2015, 7:28 AM PGY-1, [facility] FPTS Intern pager: [phone], text pages welcome

## 2015-08-18 NOTE — Progress Notes (Signed)
Pharmacy Antibiotic Note  Eileen BeltCourtney L Torres is a 30 y.o. female admitted on 08/16/2015 with sepsis secondary to pyelonephritis. Continues on ceftriaxone monotherapy. Remains afebrile, WBC 25.3>16.6>12.2. LA 2.03 on admit now normalized. UCx with 30K CFU E.coli - amp-R, unasyn-I, bactrim-R, all other sensitive. Ceftriaxone does not require renal dose adjustment. Pharmacy will sign off; re-consult if necessary.   Plan: -Vancomycin discontinued -Continue Ceftriaxone 2 gm IV Q 24 H -Can likely narrow therapy to cefazolin   Height: 5\' 3"  (160 cm) Weight: 125 lb 3.5 oz (56.8 kg) IBW/kg (Calculated) : 52.4  Temp (24hrs), Avg:100.6 F (38.1 C), Min:98.4 F (36.9 C), Max:102.7 F (39.3 C)   Recent Labs Lab 08/16/15 1246 08/16/15 1251 08/16/15 1632 08/16/15 1650 08/16/15 2031 08/17/15 0706 08/17/15 1504 08/18/15 0538  WBC  --  25.3*  --   --   --  16.6*  --  12.2*  CREATININE  --  1.01*  --   --   --  0.70 0.67 0.88  LATICACIDVEN 2.03*  --  0.80 0.8 1.2  --   --   --     Estimated Creatinine Clearance: 75.9 mL/min (by C-G formula based on SCr of 0.88 mg/dL).    No Known Allergies  Antimicrobials this admission: CTX 8/13>>  Vanc 8/13 >> 8/14   Microbiology results: 8/13 BCx: NGTD 8/13 UCx: 30K E.coli 8/13 MRSA PCR: neg    Thank you for allowing pharmacy to be a part of this patient's care.  Eileen Torres 08/18/2015 1:00 PM

## 2015-08-18 NOTE — Progress Notes (Signed)
Pt has oral temp 102.7 650 tylenol po given with 2 Ice packs Family Medicine on call notified awaiting new orders. Ilean SkillVeronica Camelle Henkels LPN

## 2015-08-18 NOTE — Progress Notes (Signed)
Dr. Gwendolyn GrantWalden made aware that cervicovaginal ancillary specimen has not been collect and that RN does not collect. MD verbal order to hold and they will evaluate need for test.

## 2015-08-18 NOTE — Clinical Social Work Note (Signed)
Clinical Social Work Assessment  Patient Details  Name: Eileen Torres MRN: [id number] Date of Birth: 03/18/1985  Date of referral:  08/18/15               Reason for consult:  Substance Use/ETOH Abuse                Permission sought to share information with:  PCP, Case Manager Permission granted to share information::  Yes, Verbal Permission Granted  Name::        Agency::     Relationship::     Contact Information:     Housing/Transportation Living arrangements for the past 2 months:  Single Family Home Source of Information:  Patient Patient Interpreter Needed:  None Criminal Activity/Legal Involvement Pertinent to Current Situation/Hospitalization:  No - Comment as needed Significant Relationships:  Parents Lives with:  Self Do you feel safe going back to the place where you live?  Yes Need for family participation in patient care:  No (Coment)  Care giving concerns:  CSW received consult regarding substance use. Patient stated that she frequently uses heroin and would like resources to help with cessation.   Social Worker assessment / plan:  CSW to offer cessation resources.  Employment status:  Other (Comment) Insurance information:  Self Pay (Medicaid Pending) PT Recommendations:  Not assessed at this time Information / Referral to community resources:  Outpatient Substance Abuse Treatment Options, Residential Substance Abuse Treatment Options  Patient/Family's Response to care:  Patient expressed appreciation for resources and asked appropriate questions.   Patient/Family's Understanding of and Emotional Response to Diagnosis, Current Treatment, and Prognosis:  Patient states that she is hopeful to stop using substances.   Emotional Assessment Appearance:  Appears stated age Attitude/Demeanor/Rapport:  Other (Appropriate) Affect (typically observed):  Accepting, Appropriate Orientation:  Oriented to Self, Oriented to Place, Oriented to  Time, Oriented to  Situation Alcohol / Substance use:  Illicit Drugs Psych involvement (Current and /or in the community):  No (Comment)  Discharge Needs  Concerns to be addressed:  Substance Abuse Concerns Readmission within the last 30 days:  No Current discharge risk:  None Barriers to Discharge:  No Barriers Identified   Mearl Latinadia S Shermaine Brigham, LCSWA 08/18/2015, 9:23 AM

## 2015-08-18 NOTE — Progress Notes (Signed)
Patient asking for nicotine gum and if it is okay that she can shower. MD paged to make aware and ask.

## 2015-08-19 ENCOUNTER — Encounter (HOSPITAL_COMMUNITY): Payer: Self-pay | Admitting: General Practice

## 2015-08-19 LAB — CBC
HEMATOCRIT: 35.1 % — AB (ref 36.0–46.0)
HEMOGLOBIN: 11.5 g/dL — AB (ref 12.0–15.0)
MCH: 27.1 pg (ref 26.0–34.0)
MCHC: 32.8 g/dL (ref 30.0–36.0)
MCV: 82.6 fL (ref 78.0–100.0)
PLATELETS: 228 10*3/uL (ref 150–400)
RBC: 4.25 MIL/uL (ref 3.87–5.11)
RDW: 13.3 % (ref 11.5–15.5)
WBC: 7.9 10*3/uL (ref 4.0–10.5)

## 2015-08-19 LAB — BASIC METABOLIC PANEL
Anion gap: 9 (ref 5–15)
BUN: 8 mg/dL (ref 6–20)
CO2: 21 mmol/L — ABNORMAL LOW (ref 22–32)
CREATININE: 0.71 mg/dL (ref 0.44–1.00)
Calcium: 8.3 mg/dL — ABNORMAL LOW (ref 8.9–10.3)
Chloride: 103 mmol/L (ref 101–111)
GFR calc Af Amer: >60 mL/min (ref >60)
GLUCOSE: 100 mg/dL — AB (ref 65–99)
POTASSIUM: 3.3 mmol/L — AB (ref 3.5–5.1)
Sodium: 133 mmol/L — ABNORMAL LOW (ref 135–145)

## 2015-08-19 MED ORDER — TRAMADOL HCL 50 MG PO TABS
50.0000 mg | ORAL_TABLET | Freq: Four times a day (QID) | ORAL | Status: DC | PRN
  Administered 2015-08-19 – 2015-08-20 (×3): 50 mg via ORAL
  Filled 2015-08-19 (×3): qty 1

## 2015-08-19 MED ORDER — DIAZEPAM 5 MG/ML IJ SOLN
INTRAMUSCULAR | Status: AC
  Filled 2015-08-19: qty 2

## 2015-08-19 MED ORDER — DIAZEPAM 5 MG/ML IJ SOLN
5.0000 mg | Freq: Once | INTRAMUSCULAR | Status: AC
  Administered 2015-08-19: 5 mg via INTRAVENOUS
  Filled 2015-08-19: qty 2

## 2015-08-19 MED ORDER — PROMETHAZINE HCL 25 MG PO TABS
12.5000 mg | ORAL_TABLET | Freq: Four times a day (QID) | ORAL | Status: DC | PRN
  Administered 2015-08-19 – 2015-08-20 (×3): 12.5 mg via ORAL
  Filled 2015-08-19 (×3): qty 1

## 2015-08-19 MED ORDER — PNEUMOCOCCAL VAC POLYVALENT 25 MCG/0.5ML IJ INJ
0.5000 mL | INJECTION | INTRAMUSCULAR | Status: AC
  Administered 2015-08-20: 0.5 mL via INTRAMUSCULAR
  Filled 2015-08-19: qty 0.5

## 2015-08-19 MED ORDER — CEPHALEXIN 500 MG PO CAPS
500.0000 mg | ORAL_CAPSULE | Freq: Four times a day (QID) | ORAL | Status: DC
  Administered 2015-08-19 – 2015-08-20 (×5): 500 mg via ORAL
  Filled 2015-08-19 (×5): qty 1

## 2015-08-19 MED ORDER — MORPHINE SULFATE (PF) 4 MG/ML IV SOLN
3.0000 mg | INTRAVENOUS | Status: DC | PRN
  Administered 2015-08-19 (×2): 3 mg via INTRAVENOUS
  Filled 2015-08-19 (×2): qty 1

## 2015-08-19 NOTE — Progress Notes (Signed)
Family Medicine Teaching Service Daily Progress Note Intern Pager: [phone]  Patient name: Eileen Torres Medical record number: [id number] Date of birth: 11/24/1985 Age: 30 y.o. Gender: female  Primary Care Provider: No primary care provider on file. Consultants: None  Code Status: Full  Pt Overview and Major Events to Date:  08/16/2015 Pt admitted to FPTS  Assessment and Plan: Eileen SchmidtCourtney L Torres a 30 y.o.femalepresenting with urinary symptoms x 1 week and back/flank pain. PMH is significant for IVDA, anxiety, asthma, depression. istat hCG negative. Zofran and 2L given in ED. Blood and urine cultures were taken and pt started on Rocephin  #Pyelonephritis -   Patient was afebrile overnight. Leukocytosis trend 25.3>16.6>12.2 - patient s/p three days of 2g Rocephin daily, transition to PO Keflex today - will follow up WBC count today - blood culture 8/13: NG for two days - urine culture 8/13: 30k colonies/mL E.coli, pan-sensitive - GC/chlamydia negative - pain control with tylenol, transition to tramadol 50 mg q6h PRN for pain control - monitor fever curve   #History of recent heroine use/IVDA - UDS in ED +for opiates and cocaine and patient admits to heroin use the morning of admission. Tremulousness, anxiety, nausea continue to be improved this morning and patient has been tolerating PO.  Continue supportive care. - transition to PO Phenergan PRN for nausea - continue diazepam 5 mg q4h PRN for anxiety/muscle soreness - continue imodium PRN diarrhea - counseling for substance abuse - Social work saw the patient and provided resources for heroin use cessation  #AKI: Improved. Creatinine improved yesterday, will follow up today.  Likely now at baseline. Bmet trend. AKI was likely due to dehydration from recent nausea/vomiting with pyelonephritis. Positive Hgb but negative RBCs in UA, consider rhabdo. - CK low at 20 U/L - continue IVF's as above - f/u BMP's  Hypokalemia -  Improved.  Potassium found to be low yesterday at 2.5, was repleted and improved to 4.5 on repeat test.  This morning hypokalemic at 3.0.  Will replete and follow up with AM BMP.   - follow up AM BMP  #asthma: patient does not take any home meds. Patient without respiratory symptoms since admission.   #depression/anxiety: patient does not take any home meds  FEN/GI: normal diet, patient now tolerating PO PPx: Lovenox   Disposition: Discharge home when afebrile on Keflex overnight  Subjective:  Patient seen in her room this morning, she is in no apparent distress. Denies N/V/D/C.    Objective: Temp:  [97.5 F (36.4 C)-99.4 F (37.4 C)] 97.5 F (36.4 C) (08/16 0536) Pulse Rate:  [58-91] 60 (08/16 0536) Resp:  [15-18] 18 (08/16 0536) BP: (102-114)/(65-75) 109/75 (08/16 0536) SpO2:  [97 %-100 %] 100 % (08/16 0536) Physical Exam: General: NAD, rests comfortably in bed Cardiovascular: RRR, no m/r/g Respiratory: CTA bil, no W/R/R Abdomen: Soft, nontender, nondistended Extremities: full ROM  Laboratory:  Recent Labs Lab 08/16/15 1251 08/17/15 0706 08/18/15 0538  WBC 25.3* 16.6* 12.2*  HGB 14.0 10.9* 9.9*  HCT 40.7 33.1* 29.4*  PLT 214 158 159    Recent Labs Lab 08/16/15 1251 08/17/15 0706 08/17/15 1504 08/18/15 0538  NA 134* 135 133* 134*  K 3.2* 2.5* 4.5 3.0*  CL 97* 102 102 105  CO2 25 23 16* 20*  BUN 19 12 10 10   CREATININE 1.01* 0.70 0.67 0.88  CALCIUM 8.9 8.0* 8.3* 7.8*  PROT 7.9  --   --   --   BILITOT 0.9  --   --   --  ALKPHOS 89  --   --   --   ALT 12*  --   --   --   AST 17  --   --   --   GLUCOSE 154* 117* 94 129*     Howard PouchLauren Rozalia Dino, MD 08/19/2015, 11:42 AM PGY-1, [facility] FPTS Intern pager: [phone], text pages welcome

## 2015-08-20 LAB — CBC
HEMATOCRIT: 38.5 % (ref 36.0–46.0)
Hemoglobin: 12.5 g/dL (ref 12.0–15.0)
MCH: 27.1 pg (ref 26.0–34.0)
MCHC: 32.5 g/dL (ref 30.0–36.0)
MCV: 83.3 fL (ref 78.0–100.0)
Platelets: 276 10*3/uL (ref 150–400)
RBC: 4.62 MIL/uL (ref 3.87–5.11)
RDW: 13.2 % (ref 11.5–15.5)
WBC: 8.7 10*3/uL (ref 4.0–10.5)

## 2015-08-20 LAB — BASIC METABOLIC PANEL
ANION GAP: 8 (ref 5–15)
BUN: 10 mg/dL (ref 6–20)
CALCIUM: 8.2 mg/dL — AB (ref 8.9–10.3)
CO2: 24 mmol/L (ref 22–32)
Chloride: 101 mmol/L (ref 101–111)
Creatinine, Ser: 0.63 mg/dL (ref 0.44–1.00)
Glucose, Bld: 105 mg/dL — ABNORMAL HIGH (ref 65–99)
POTASSIUM: 3.1 mmol/L — AB (ref 3.5–5.1)
Sodium: 133 mmol/L — ABNORMAL LOW (ref 135–145)

## 2015-08-20 MED ORDER — NAPROXEN 500 MG PO TABS: 500.0000 mg | ORAL_TABLET | Freq: Two times a day (BID) | ORAL | 0 refills | Status: DC

## 2015-08-20 MED ORDER — POTASSIUM CHLORIDE CRYS ER 20 MEQ PO TBCR: 40.0000 meq | EXTENDED_RELEASE_TABLET | Freq: Once | ORAL | Status: DC

## 2015-08-20 MED ORDER — PROMETHAZINE HCL 12.5 MG PO TABS: 12.5000 mg | ORAL_TABLET | Freq: Four times a day (QID) | ORAL | 0 refills | Status: DC | PRN

## 2015-08-20 MED ORDER — POTASSIUM CHLORIDE CRYS ER 20 MEQ PO TBCR
40.0000 meq | EXTENDED_RELEASE_TABLET | ORAL | Status: DC
  Administered 2015-08-20: 40 meq via ORAL
  Filled 2015-08-20: qty 2

## 2015-08-20 MED ORDER — CEPHALEXIN 500 MG PO CAPS: 500.0000 mg | ORAL_CAPSULE | Freq: Four times a day (QID) | ORAL | 0 refills | Status: AC

## 2015-08-20 MED ORDER — DIAZEPAM 5 MG PO TABS: 5.0000 mg | ORAL_TABLET | Freq: Four times a day (QID) | ORAL | 0 refills | Status: AC | PRN

## 2015-08-20 MED ORDER — ACETAMINOPHEN 325 MG PO TABS: 650.0000 mg | ORAL_TABLET | Freq: Four times a day (QID) | ORAL | 0 refills | Status: AC | PRN

## 2015-08-20 MED ORDER — POTASSIUM CHLORIDE CRYS ER 20 MEQ PO TBCR: 80.0000 meq | EXTENDED_RELEASE_TABLET | Freq: Once | ORAL | Status: DC

## 2015-08-20 NOTE — Progress Notes (Cosign Needed)
Family Medicine Teaching Service Daily Progress Note Intern Pager: [phone]  Patient name: Eileen Torres Medical record number: [id number] Date of birth: 06/17/1985 Age: 30 y.o. Gender: female  Primary Care Provider: No PCP Per Patient Consultants: None Code Status: FULL  Pt Overview and Major Events to Date:  08/16/2015: admitted to FPTS  Assessment and Plan: Eileen SchmidtCourtney L Gurkinis a 30 y.o.femalepresenting with urinary symptoms x 1 week and back/flank pain. PMH is significant for IVDA, anxiety, asthma, depression. istat hCG negative. Zofran and 2L given in ED. Blood and urine cultures were taken and pt started on Rocephin.   #Pyelonephritis-   Patient was afebrile overnight. No longer with leukocytosis.  Blood cultures were negative, Urine cultures showed pan-sensitive E coli. - patient s/p three days of 2g Rocephin daily, transition to PO Keflex yesterday for 14d total antibiotics (8/13 - 8/26) - tramodol, tylenol for pain control   #History of recent heroine use/IVDA - UDS in ED +for opiates and cocaine and patient admits to heroin use the morning of admission. Patient no longer with tremulousness, anxiety or nausea. - PO phenergan for nausea - diazepam 5mg  q4h for anxiety/withdrawal symptoms - holding clonidine for withdrawal as pressures dropped with this medication - imodium PRN diarrhea - substance abuse counseling - social work saw patient and provided resources for heroin use cessation   #AKI: Improved. Creatinine at baseline. AKI was likely due to dehydration from recent nausea/vomiting with pyelonephritis.  - IVF discontinued as patient tolerates PO  #Hypokalemia - Potassium mildly low yesterday at 3.3. Will follow up AM BMP today and [facility].  #asthma: patient does not take any home meds. Patient without respiratory symptoms since admission.   #depression/anxiety: patient does not take any home meds  FEN/GI: normal diet, patient now  tolerating PO PPx: Lovenox  Disposition: Home, patient clinically improved and afebrile on PO antibiotics x 24h  Subjective:  Patient states she feels much improved today.  She has been afebrile.  No nausea, vomiting, or diarrhea.  Endorses mild anxiety.  Objective: Temp:  [97.8 F (36.6 C)-98.8 F (37.1 C)] 97.9 F (36.6 C) (08/17 0556) Pulse Rate:  [63-72] 65 (08/17 0556) Resp:  [16-18] 16 (08/17 0556) BP: (86-121)/(50-79) 97/62 (08/17 0556) SpO2:  [99 %-100 %] 99 % (08/17 0556) Physical Exam: General: NAD, rests comfortably in bed Cardiovascular: RRR, no m/r/g Respiratory: CTA bilaterally, no wheezes, rhonchi or rales Abdomen: Soft, nontender, nondistended, normoactive BS GU: +mild CVA tenderness on the left Extremities: full ROM in 4 extremities with no LE edema bilaterally  Laboratory:  Recent Labs Lab 08/18/15 0538 08/19/15 1150 08/20/15 0550  WBC 12.2* 7.9 8.7  HGB 9.9* 11.5* 12.5  HCT 29.4* 35.1* 38.5  PLT 159 228 276    Recent Labs Lab 08/16/15 1251  08/17/15 1504 08/18/15 0538 08/19/15 1150  NA 134*  < > 133* 134* 133*  K 3.2*  < > 4.5 3.0* 3.3*  CL 97*  < > 102 105 103  CO2 25  < > 16* 20* 21*  BUN 19  < > 10 10 8   CREATININE 1.01*  < > 0.67 0.88 0.71  CALCIUM 8.9  < > 8.3* 7.8* 8.3*  PROT 7.9  --   --   --   --   BILITOT 0.9  --   --   --   --   ALKPHOS 89  --   --   --   --   ALT 12*  --   --   --   --  AST 17  --   --   --   --   GLUCOSE 154*  < > 94 129* 100*  < > = values in this interval not displayed.    Imaging/Diagnostic Tests: None.  Howard PouchLauren Zeriyah Wain, MD 08/20/2015, 8:08 AM PGY-1, [facility] FPTS Intern pager: [phone], text pages welcome

## 2015-08-20 NOTE — Discharge Instructions (Signed)
You were admitted to the hospital with a kidney infection, which was treated with pain control, fluids and antibiotics. -   We are sending you home with antibiotics, which you should take every day as prescribed until 8/26.    - You also had symptoms of heroin withdrawal throughout your hospitalization, and so you are discharged with supportive care for your withdrawal symptoms.    Please follow up in our office in one week, you have an appointment scheduled with Dr. Caroleen Hammanumley on Friday 8/25 at 2pm.    Pyelonephritis, Adult Pyelonephritis is a kidney infection. The kidneys are the organs that filter a person's blood and move waste out of the bloodstream and into the urine. Urine passes from the kidneys, through the ureters, and into the bladder. There are two main types of pyelonephritis:  Infections that come on quickly without any warning (acute pyelonephritis).  Infections that last for a long period of time (chronic pyelonephritis). In most cases, the infection clears up with treatment and does not cause further problems. More severe infections or chronic infections can sometimes spread to the bloodstream or lead to other problems with the kidneys. CAUSES This condition is usually caused by:  Bacteria traveling from the bladder to the kidney through infected urine. The urine in the bladder can become infected with bacteria from:  Bladder infection (cystitis).  Inflammation of the prostate gland (prostatitis).  Sexual intercourse, in females.  Bacteria traveling from the bloodstream to the kidney. RISK FACTORS This condition is more likely to develop in:  Pregnant women.  Older people.  People who have diabetes.  People who have kidney stones or bladder stones.  People who have other abnormalities of the kidney or ureter.  People who have a catheter placed in the bladder.  People who have cancer.  People who are sexually active.  Women who use spermicides.  People who  have had a prior urinary tract infection. SYMPTOMS Symptoms of this condition include:  Frequent urination.  Strong or persistent urge to urinate.  Burning or stinging when urinating.  Abdominal pain.  Back pain.  Pain in the side or flank area.  Fever.  Chills.  Blood in the urine, or dark urine.  Nausea.  Vomiting. DIAGNOSIS This condition may be diagnosed based on:  Medical history and physical exam.  Urine tests.  Blood tests. You may also have imaging tests of the kidneys, such as an ultrasound or CT scan. TREATMENT Treatment for this condition may depend on the severity of the infection.  If the infection is mild and is found early, you may be treated with antibiotic medicines taken by mouth. You will need to drink fluids to remain hydrated.  If the infection is more severe, you may need to stay in the hospital and receive antibiotics given directly into a vein through an IV tube. You may also need to receive fluids through an IV tube if you are not able to remain hydrated. After your hospital stay, you may need to take oral antibiotics for a period of time. Other treatments may be required, depending on the cause of the infection. HOME CARE INSTRUCTIONS Medicines  Take over-the-counter and prescription medicines only as told by your health care provider.  If you were prescribed an antibiotic medicine, take it as told by your health care provider. Do not stop taking the antibiotic even if you start to feel better. General Instructions  Drink enough fluid to keep your urine clear or pale yellow.  Avoid caffeine,  tea, and carbonated beverages. They tend to irritate the bladder.  Urinate often. Avoid holding in urine for long periods of time.  Urinate before and after sex.  After a bowel movement, women should cleanse from front to back. Use each tissue only once.  Keep all follow-up visits as told by your health care provider. This is important. SEEK  MEDICAL CARE IF:  Your symptoms do not get better after 2 days of treatment.  Your symptoms get worse.  You have a fever. SEEK IMMEDIATE MEDICAL CARE IF:  You are unable to take your antibiotics or fluids.  You have shaking chills.  You vomit.  You have severe flank or back pain.  You have extreme weakness or fainting.   This information is not intended to replace advice given to you by your health care provider. Make sure you discuss any questions you have with your health care provider.   Document Released: 1November 16, 202006 Document Revised: 09/10/2014 Document Reviewed: 04/14/2014 Elsevier Interactive Patient Education [employer]2016 Elsevier Inc.

## 2015-08-20 NOTE — Discharge Summary (Signed)
Family Medicine Teaching [facility] Discharge Summary  Patient name: Eileen BeltCourtney L Torres Medical record number: [id number] Date of birth: 08/19/1985 Age: 30 y.o. Gender: female Date of Admission: 08/16/2015  Date of Discharge: 08/20/2015 Admitting Physician: Doreene ElandKehinde T Eniola, MD  Primary Care Provider: No PCP Per Patient Consultants: None  Indication for Hospitalization: pyelonephritis  Discharge Diagnoses/Problem List:  Patient Active Problem List   Diagnosis Date Noted  . Cocaine abuse 08/16/2015  . Pyelonephritis   . Sepsis (HCC)   . Heroin user 01/25/2015     Disposition: Home  Discharge Condition: Stable  Discharge Exam:  Objective: Temp:  [97.8 F (36.6 C)-98.8 F (37.1 C)] 97.9 F (36.6 C) (08/17 0556) Pulse Rate:  [63-72] 65 (08/17 0556) Resp:  [16-18] 16 (08/17 0556) BP: (86-121)/(50-79) 97/62 (08/17 0556) SpO2:  [99 %-100 %] 99 % (08/17 0556) Physical Exam: General: NAD, rests comfortably in bed Cardiovascular: RRR, no m/r/g Respiratory: CTA bilaterally, no wheezes, rhonchi or rales Abdomen: Soft, nontender, nondistended, normoactive BS GU: +mild CVA tenderness on the left Extremities: full ROM in 4 extremities with no LE edema bilaterally  Brief Hospital Course:  Patient was admitted with presentation concerning for sepsis, apparently secondary to pyelonephritis with UTI positive for pan-sensitive E coli.  Blood cultures were sent and he was treated with Rocephin for possible bacteremia, and received supportive care with IVF and pain control. Blood cultures came back negative, and GC chlamydia urine test was negative.  Patient was with fevers overnight on rocephin.  She was monitored and transitioned to PO Keflex, and after she was afebrile overnight on this antibiotic was considered stable for discharge.   Of note patient also admitted to recent heroin use the morning of admission, and had withdrawal symptoms that were managed with supportive care throughout  hospitalization.  She received PRN valium and phenergan for her nausea, tremulousness and anxiety.  Her symptoms improved. She received counseling from our team as well as resources from social work for discontinuing heroin use.   Issues for Follow Up:  1. Heroin use - continued counseling and supportive care 2. Patient discharged with Keflex course to be completed 8/26 3. Patient was mildly hypokalemic on the day of discharge at 3.1, [facility].    Significant Procedures:  None  Significant Labs and Imaging:   Recent Labs Lab 08/18/15 0538 08/19/15 1150 08/20/15 0550  WBC 12.2* 7.9 8.7  HGB 9.9* 11.5* 12.5  HCT 29.4* 35.1* 38.5  PLT 159 228 276    Recent Labs Lab 08/18/15 0538 08/19/15 1150 08/20/15 0752  NA 134* 133* 133*  K 3.0* 3.3* 3.1*  CL 105 103 101  CO2 20* 21* 24  GLUCOSE 129* 100* 105*  BUN 10 8 10   CREATININE 0.88 0.71 0.63  CALCIUM 7.8* 8.3* 8.2*    Results/Tests Pending at Time of Discharge: None  Discharge Medications:    Medication List    TAKE these medications   acetaminophen 325 MG tablet Commonly known as:  TYLENOL Take 2 tablets (650 mg total) by mouth every 6 (six) hours as needed for mild pain (or Fever >/= 101).   cephALEXin 500 MG capsule Commonly known as:  KEFLEX Take 1 capsule (500 mg total) by mouth every 6 (six) hours.   diazepam 5 MG tablet Commonly known as:  VALIUM Take 1 tablet (5 mg total) by mouth every 6 (six) hours as needed for anxiety or muscle spasms.   naproxen 500 MG tablet Commonly known as:  NAPROSYN Take 1  tablet (500 mg total) by mouth 2 (two) times daily with a meal.   promethazine 12.5 MG tablet Commonly known as:  PHENERGAN Take 1 tablet (12.5 mg total) by mouth every 6 (six) hours as needed for nausea.       Discharge Instructions: Please refer to Patient Instructions section of EMR for full details.  Patient was counseled important signs and symptoms that should prompt return to medical  care, changes in medications, dietary instructions, activity restrictions, and follow up appointments.   Follow-Up Appointments: Follow-up Information    [place], [place]. Go on 08/28/2015.   Specialty:  Family Medicine Why:  You have an appointment for follow up with Dr. Caroleen Hammanumley at 2:00 pm on Friday, 8/25. Contact information: [address] [place] [address] 706 [phone]           Howard PouchLauren Elois Averitt, MD 08/24/2015, 7:40 PM PGY-1, [facility]

## 2015-08-20 NOTE — Progress Notes (Signed)
Pt given discharge instructions, prescriptions, and care notes. Pt verbalized understanding AEB no further questions or concerns at this time. IV was discontinued, no redness, pain, or swelling noted at this time. Pt left the floor in stable condition. 

## 2015-08-21 LAB — CULTURE, BLOOD (ROUTINE X 2)
CULTURE: NO GROWTH
CULTURE: NO GROWTH

## 2015-08-28 ENCOUNTER — Ambulatory Visit (INDEPENDENT_AMBULATORY_CARE_PROVIDER_SITE_OTHER): Payer: Medicaid Other | Admitting: Family Medicine

## 2015-08-28 ENCOUNTER — Encounter: Payer: Self-pay | Admitting: Family Medicine

## 2015-08-28 VITALS — BP 84/55 | HR 104 | Temp 98.1°F | Ht 63.0 in | Wt 113.4 lb

## 2015-08-28 DIAGNOSIS — Z23 Encounter for immunization: Secondary | ICD-10-CM

## 2015-08-28 DIAGNOSIS — F1410 Cocaine abuse, uncomplicated: Secondary | ICD-10-CM | POA: Diagnosis not present

## 2015-08-28 DIAGNOSIS — N12 Tubulo-interstitial nephritis, not specified as acute or chronic: Secondary | ICD-10-CM | POA: Diagnosis present

## 2015-08-28 LAB — POCT URINALYSIS DIPSTICK
GLUCOSE UA: NEGATIVE
Nitrite, UA: NEGATIVE
Protein, UA: 30
SPEC GRAV UA: 1.025
UROBILINOGEN UA: 1
pH, UA: 5.5

## 2015-08-28 LAB — POCT UA - MICROSCOPIC ONLY

## 2015-08-28 LAB — BASIC METABOLIC PANEL
BUN: 21 mg/dL (ref 7–25)
CALCIUM: 9.9 mg/dL (ref 8.6–10.2)
CO2: 24 mmol/L (ref 20–31)
Chloride: 104 mmol/L (ref 98–110)
Creat: 0.98 mg/dL (ref 0.50–1.10)
GLUCOSE: 85 mg/dL (ref 65–99)
Potassium: 5.2 mmol/L (ref 3.5–5.3)
SODIUM: 139 mmol/L (ref 135–146)

## 2015-08-28 NOTE — Assessment & Plan Note (Signed)
-  Improved -Continue Keflex -Will obtain urinalysis and BMP -Follow-up with PCP to establish care at clinic.

## 2015-08-28 NOTE — Progress Notes (Signed)
Subjective:     Patient ID: Eileen Torres, female   DOB: 07/23/1985, 30 y.o.   MRN: [id number]  HPI Mrs. Carleene MainsGurkin is a 30 year old female presenting today for hospital follow-up. -Hospitalized from 8/13 to 08/20/2015 for pyelonephritis. Urinalysis with culture showed a UTI with pansensitive Escherichia coli. Initiated on Rocephin. Transitioned to by mouth Keflex. History of heroin use noted morning of admission: Experience withdrawal symptoms throughout hospitalization. Discharged and instructed to continue Keflex until 08/29/2015. -Reports improvement in symptoms, however still feels weak. Notes somewhat tenderness and left flank. At discharge, noted some nausea and vomiting however this is improved. -Denies diarrhea, constipation, fever, dysuria, urinary frequency -Still taking Keflex. Denies any acute complaints for medication. -Admits to snorting cocaine. States she is looking for a drug rehabilitation facility. Refuses social work help stating she received she needed from hospitalization.  Review of Systems Per HPI. Other systems negative.    Objective:   Physical Exam  Constitutional: She appears well-developed and well-nourished. No distress.  Smelled of marijuana.  Cardiovascular: Normal rate and regular rhythm.   No murmur heard. Pulmonary/Chest: Effort normal. No respiratory distress. She has no wheezes.  Abdominal: Soft. Bowel sounds are normal. She exhibits no distension.  Left CVA tenderness. No abdominal tenderness noted.  Musculoskeletal: She exhibits no edema.      Assessment and Plan:     Pyelonephritis -Improved -Continue Keflex -Will obtain urinalysis and BMP -Follow-up with PCP to establish care at clinic.  Cocaine abuse -Admitted to cocaine use. States she is starting to sit up injecting to prevent withdrawal. -Offered discussion with social work. Refuses.

## 2015-08-28 NOTE — Patient Instructions (Signed)
Thank you so much for coming to visit today! I'm glad you're doing better. Please continue your antibiotics until your course is complete. We will check your urine for infection and your potassium level. We'll mail you a letter with the results. I encourage you to look further into programs to help you quit you are driving addiction. Please let us know if he [place] our social worker to give you further resources.  Dr. Caroleen Hammanumley

## 2015-08-28 NOTE — Assessment & Plan Note (Signed)
-  Admitted to cocaine use. States she is starting to sit up injecting to prevent withdrawal. -Offered discussion with social work. Refuses.

## 2016-01-05 ENCOUNTER — Ambulatory Visit (HOSPITAL_COMMUNITY)
Admission: EM | Admit: 2016-01-05 | Discharge: 2016-01-05 | Discharge status: Against Medical Advice | Payer: Medicaid Other | Attending: Family Medicine | Admitting: Family Medicine

## 2016-01-05 NOTE — ED Notes (Signed)
Pt no answer in the waiting room

## 2016-01-05 NOTE — ED Notes (Signed)
Patient called and failed from the lobby

## 2016-01-20 NOTE — ED Provider Notes (Signed)
[facility]    CSN: [id number] Arrival date & time: 01/05/16  1045     History   Chief Complaint No chief complaint on file.   HPI RAVINDER HOFLAND is a 31 y.o. female.   HPI  Past Medical History:  Diagnosis Date  . Anxiety   . Bipolar disorder (HCC)   . Childhood asthma   . Depression   . Encounter for Essure implantation 09/2011  . GERD (gastroesophageal reflux disease)   . HSV (herpes simplex virus) infection 2009   ?outbreak could be HSV last pregnancy-not definate diagnosis per pt and chart- pt on Valtrex prophylacttcally  . Migraines    08/19/2015 "4-6 good ones/year"  . Pyelonephritis     Patient Active Problem List   Diagnosis Date Noted  . Cocaine abuse 08/16/2015  . Pyelonephritis   . Sepsis (HCC)   . 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 DILITATION & CURRETTAGE/HYSTROSCOPY WITH ESSURE  2013    OB History    Gravida Para Term Preterm AB Living   2 2 1 1   2    SAB TAB Ectopic Multiple Live Births           1       Home Medications    Prior to Admission medications   Medication Sig Start Date End Date Taking? Authorizing Provider  naproxen (NAPROSYN) 500 MG tablet Take 1 tablet (500 mg total) by mouth 2 (two) times daily with a meal. 08/20/15   [name], MD  promethazine (PHENERGAN) 12.5 MG tablet Take 1 tablet (12.5 mg total) by mouth every 6 (six) hours as needed for nausea. 08/20/15 08/27/15  [name], MD    Family History Family History  Problem Relation Age of Onset  . Cancer Other   . Diabetes Other   . Heart failure Other   . Stroke Other     Social History Social History  Substance Use Topics  . Smoking status: Current Every Day Smoker    Packs/day: 0.25    Years: 19.00    Types: Cigarettes  . Smokeless tobacco: Never Used  . Alcohol use No     Allergies   Patient has no known allergies.   Review of Systems Review of Systems   Physical Exam Triage Vital  Signs ED Triage Vitals [01/05/16 1225]  Enc Vitals Group     BP      Pulse      Resp      Temp      Temp src      SpO2      Weight      Height      Head Circumference      Peak Flow      Pain Score 0     Pain Loc      Pain Edu?      Excl. in GC?    No data found.   Updated Vital Signs There were no vitals taken for this visit.  Visual Acuity Right Eye Distance:   Left Eye Distance:   Bilateral Distance:    Right Eye Near:   Left Eye Near:    Bilateral Near:     Physical Exam   UC Treatments / Results  Labs (all labs ordered are listed, but only abnormal results are displayed) Labs Reviewed - No data to display  EKG  EKG Interpretation None       Radiology No results  found.  Procedures Procedures (including critical care time)  Medications Ordered in UC Medications - No data to display   Initial Impression / Assessment and Plan / UC Course  I have reviewed the triage vital signs and the nursing notes.  Pertinent labs & imaging results that were available during my care of the patient were reviewed by me and considered in my medical decision making (see chart for details).  Clinical Course       Final Clinical Impressions(s) / UC Diagnoses   Final diagnoses:  None    New Prescriptions Discharge Medication List as of 01/05/2016 12:26 PM       Linna HoffJames D Kerryann Allaire, MD 01/22/16 1427

## 2016-01-27 ENCOUNTER — Ambulatory Visit (HOSPITAL_COMMUNITY)
Admission: RE | Admit: 2016-01-27 | Discharge: 2016-01-27 | Disposition: A | Discharge status: Home / Self Care | Payer: Self-pay | Attending: Psychiatry | Admitting: Psychiatry

## 2016-01-27 DIAGNOSIS — R451 Restlessness and agitation: Secondary | ICD-10-CM | POA: Insufficient documentation

## 2016-01-27 DIAGNOSIS — F068 Other specified mental disorders due to known physiological condition: Secondary | ICD-10-CM | POA: Insufficient documentation

## 2016-01-27 DIAGNOSIS — F1910 Other psychoactive substance abuse, uncomplicated: Secondary | ICD-10-CM | POA: Insufficient documentation

## 2016-01-27 NOTE — BH Assessment (Signed)
Tele Assessment Note   Eileen Torres is an 31 y.o. female presents to [facility] stating she came to the facility alone and is seeking treatment for drug abuse.   The patient reports daily use of Heroine IV, Crack Cocaine and Xanax abuse. patient was clean for 10 yrs then relapsed during a difficult period when her mother died and she was going through a separation.   The patient has been using drugs for 3 yrs having a few weeks of sobriety in October of last year after an admission to [facility].  The patient denies SI, HI or A/V. Admits to having Bipolar Disorder and some mood fluctuation. Patient has not worked in several years. Has some college courses and completed her high school degree.  Patient reports Bipolar and depression in the family history and substance abuse on both maternal and paternal side.   Patient was tearful in the interview stating she is ready for help.  The patient appeared to be of low weight, had dark circles under her eyes, appeared anxious with blunted affect, had fair judgement and insight.   The patient's boyfriend was also in the lobby seeking treatment, something the patient neglected to inform this clinician. The patient's boyfriend, Eileen Torres and patient were informed that [facility] could pursue treatment for them but they would not be going to the same facility for drug treatment. Mr. [name] did not want to stay to pursue placement at a facility for detox, the patient also agreed to leave. Eileen Sievert, DNP did not feel the patient meet crisis criteria. The patient left the facility but was encouraged to return on her own if she wanted to seek treatment in the future.    Diagnosis: Bipolar Disorder, Opiate Dependence, Benzodiazepine abuse, PTSD, Anxiety Disorder NOS  Past Medical History:  Past Medical History:  Diagnosis Date  . Anxiety   . Bipolar disorder (HCC)   . Childhood asthma   . Depression   . Encounter for Essure implantation 09/2011  . GERD  (gastroesophageal reflux disease)   . HSV (herpes simplex virus) infection 2009   ?outbreak could be HSV last pregnancy-not definate diagnosis per pt and chart- pt on Valtrex prophylacttcally  . Migraines    08/19/2015 "4-6 good ones/year"  . Pyelonephritis     Past Surgical History:  Procedure Laterality Date  . DILITATION & CURRETTAGE/HYSTROSCOPY WITH ESSURE  2013    Family History:  Family History  Problem Relation Age of Onset  . Cancer Other   . Diabetes Other   . Heart failure Other   . Stroke Other     Social History:  reports that she has been smoking Cigarettes.  She has a 4.75 pack-year smoking history. She has never used smokeless tobacco. She reports that she uses drugs. She reports that she does not drink alcohol.  Additional Social History:  Alcohol / Drug Use Pain Medications: see MAR Prescriptions: see MAR Over the Counter: see MAR History of alcohol / drug use?: Yes Substance #1 Name of Substance 1: Heroin-IV 1 - Age of First Use: 15 1 - Amount (size/oz): 1-3 grams 1 - Frequency: daily 1 - Duration: 3 rys 1 - Last Use / Amount: today, had 10 yrs of soberity and relapsed 3 yrs ago Substance #2 Name of Substance 2: Crack Cocaine 2 - Frequency: daily 2 - Last Use / Amount: yesterday Substance #3 Name of Substance 3: xanax 3 - Amount (size/oz): 1-3 mg 3 - Frequency: daily 3 - Last Use /  Amount: yesterday Substance #4 Name of Substance 4: alcohol 4 - Frequency: weekly 4 - Last Use / Amount: 3 or 4 days ago  CIWA:   COWS:    PATIENT STRENGTHS: (choose at least two) Average or above average intelligence Motivation for treatment/growth  Allergies: No Known Allergies  Home Medications:  (Not in a hospital admission)  OB/GYN Status:  No LMP recorded.  General Assessment Data Location of Assessment: [facility] TTS Assessment: In system Is this a Tele or Face-to-Face Assessment?: Face-to-Face Is this an Initial Assessment or a  Re-assessment for this encounter?: Initial Assessment Marital status: Single Is patient pregnant?: No Pregnancy Status: No Living Arrangements: Parent (father) Can pt return to current living arrangement?: Yes Admission Status: Voluntary Is patient capable of signing voluntary admission?: Yes Referral Source: Self/Family/Friend Insurance type: self pay  Medical Screening Exam [facility] Walk-in ONLY) Medical Exam completed: No Reason for MSE not completed: Patient Refused (refused treatment)  Crisis Care Plan Living Arrangements: Parent (father) Name of Psychiatrist: n/a Name of Therapist: n/a  Education Status Is patient currently in school?: No  Risk to self with the past 6 months Suicidal Ideation: No Has patient been a risk to self within the past 6 months prior to admission? : No Suicidal Intent: No Has patient had any suicidal intent within the past 6 months prior to admission? : No Is patient at risk for suicide?: No Suicidal Plan?: No Has patient had any suicidal plan within the past 6 months prior to admission? : No Access to Means: No Previous Attempts/Gestures: No Intentional Self Injurious Behavior: None Family Suicide History: No Recent stressful life event(s): Other (Comment), Trauma (Comment), Financial Problems (drug use) Persecutory voices/beliefs?: No Depression: Yes Depression Symptoms: Insomnia, Isolating, Loss of interest in usual pleasures, Feeling worthless/self pity Substance abuse history and/or treatment for substance abuse?: Yes Suicide prevention information given to non-admitted patients: Not applicable  Risk to Others within the past 6 months Homicidal Ideation: No Does patient have any lifetime risk of violence toward others beyond the six months prior to admission? : No Thoughts of Harm to Others: No Current Homicidal Intent: No Current Homicidal Plan: No Access to Homicidal Means: No History of harm to others?: No Assessment of Violence:  None Noted Does patient have access to weapons?: No Criminal Charges Pending?: No Does patient have a court date: No Is patient on probation?: No  Psychosis Hallucinations: None noted Delusions: None noted  Mental Status Report Appearance/Hygiene: Unremarkable Eye Contact: Good Motor Activity: Unremarkable Speech: Logical/coherent Level of Consciousness: Alert Mood: Anxious Affect: Blunted, Sad Anxiety Level: Severe Thought Processes: Coherent, Relevant Judgement: Unimpaired Orientation: Situation, Time, Place, Person Obsessive Compulsive Thoughts/Behaviors: None  Cognitive Functioning Concentration: Decreased Memory: Recent Intact, Remote Intact IQ: Average Insight: Fair Impulse Control: Poor Appetite: Poor Sleep: Decreased  ADLScreening [facility]) Patient's cognitive ability adequate to safely complete daily activities?: Yes Patient able to express need for assistance with ADLs?: Yes Independently performs ADLs?: Yes (appropriate for developmental age)  Prior Inpatient Therapy Prior Inpatient Therapy: Yes Prior Therapy Dates: 10/2015 and 2007 Prior Therapy Facilty/Provider(s): [facility], [facility] Reason for Treatment: detox from opiates  Prior Outpatient Therapy Prior Outpatient Therapy: No Does patient have an ACCT team?: No Does patient have Intensive In-House Services?  : No Does patient have [facility] services? : No Does patient have P4CC services?: No  ADL Screening (condition at time of admission) Patient's cognitive ability adequate to safely complete daily activities?: Yes Is the patient deaf  or have difficulty hearing?: No Does the patient have difficulty seeing, even when wearing glasses/contacts?: No Does the patient have difficulty concentrating, remembering, or making decisions?: Yes Patient able to express need for assistance with ADLs?: Yes Does the patient have difficulty dressing or bathing?: No Independently performs  ADLs?: Yes (appropriate for developmental age)       Abuse/Neglect Assessment (Assessment to be complete while patient is alone) Physical Abuse: Denies Verbal Abuse: Denies Sexual Abuse: Denies     Merchant navy officerAdvance Directives (For Healthcare) Does Patient Have a Medical Advance Directive?: No    Additional Information 1:1 In Past 12 Months?: No CIRT Risk: No Elopement Risk: No Does patient have medical clearance?: No     Disposition:  Disposition Initial Assessment Completed for this Encounter: Yes Disposition of Patient: Other dispositions (patient does not meet criteria.,left the facility) Other disposition(s): Other (Comment) Karleen Hampshire(Spencer Simon notified)  Vonzell Schlattershley H [facility] 01/27/2016 9:44 PM

## 2016-01-28 NOTE — H&P (Signed)
Behavioral Health Medical Screening Exam  Eileen Torres is an 31 y.o. female, presenting with her BF, requesting heroin detox. The patient is denying known acute illnesses or comorbidites. She is denying depression, SI/SA or HI.  Total Time spent with patient: 20 minutes  Psychiatric Specialty Exam: Physical Exam  Constitutional: She is oriented to person, place, and time. No distress.  HENT:  Head: Normocephalic.  Neurological: She is alert and oriented to person, place, and time. No cranial nerve deficit.  Skin: Skin is warm and dry. She is not diaphoretic.  Psychiatric: Her speech is normal. Her mood appears anxious. She is agitated. Cognition and memory are impaired. She expresses impulsivity. She expresses no homicidal and no suicidal ideation. She expresses no suicidal plans and no homicidal plans.    Review of Systems  Psychiatric/Behavioral: Positive for substance abuse. Negative for depression and suicidal ideas. The patient is nervous/anxious.   All other systems reviewed and are negative.   There were no vitals taken for this visit.There is no height or weight on file to calculate BMI.  General Appearance: Disheveled  Eye Contact:  Fair  Speech:  Clear and Coherent  Volume:  Normal  Mood:  Anxious  Affect:  Congruent  Thought Process:  Goal Directed  Orientation:  Full (Time, Place, and Person)  Thought Content:  Negative  Suicidal Thoughts:  No  Homicidal Thoughts:  No  Memory:  Recent;   Good  Judgement:  Impaired  Insight:  Lacking  Psychomotor Activity:  Negative  Concentration: Concentration: Fair  Recall:  FiservFair  Fund of Knowledge:Fair  Language: Fair  Akathisia:  Negative  Handed:  Right  AIMS (if indicated):     Assets:  Desire for Improvement  Sleep:       Musculoskeletal: Strength & Muscle Tone: within normal limits Gait & Station: normal Patient leans: N/A  There were no vitals taken for this visit.  Recommendations:  Based on my  evaluation the patient does not appear to have an emergency medical condition.  Baudelio Karnes E, PA-C 01/28/2016, 2:07 AM

## 2016-02-09 ENCOUNTER — Emergency Department (HOSPITAL_COMMUNITY): Payer: Self-pay

## 2016-02-09 ENCOUNTER — Encounter (HOSPITAL_COMMUNITY): Payer: Self-pay | Admitting: Emergency Medicine

## 2016-02-09 ENCOUNTER — Inpatient Hospital Stay (HOSPITAL_COMMUNITY)
Admission: EM | Admit: 2016-02-09 | Discharge: 2016-02-11 | DRG: 872 | Disposition: A | Discharge status: Home / Self Care | Payer: Self-pay | Attending: Family Medicine | Admitting: Family Medicine

## 2016-02-09 DIAGNOSIS — E876 Hypokalemia: Secondary | ICD-10-CM | POA: Diagnosis present

## 2016-02-09 DIAGNOSIS — Z791 Long term (current) use of non-steroidal anti-inflammatories (NSAID): Secondary | ICD-10-CM

## 2016-02-09 DIAGNOSIS — F13239 Sedative, hypnotic or anxiolytic dependence with withdrawal, unspecified: Secondary | ICD-10-CM | POA: Diagnosis present

## 2016-02-09 DIAGNOSIS — F1410 Cocaine abuse, uncomplicated: Secondary | ICD-10-CM | POA: Diagnosis present

## 2016-02-09 DIAGNOSIS — N12 Tubulo-interstitial nephritis, not specified as acute or chronic: Secondary | ICD-10-CM | POA: Diagnosis present

## 2016-02-09 DIAGNOSIS — F1920 Other psychoactive substance dependence, uncomplicated: Secondary | ICD-10-CM

## 2016-02-09 DIAGNOSIS — F1120 Opioid dependence, uncomplicated: Secondary | ICD-10-CM | POA: Diagnosis present

## 2016-02-09 DIAGNOSIS — E871 Hypo-osmolality and hyponatremia: Secondary | ICD-10-CM | POA: Diagnosis present

## 2016-02-09 DIAGNOSIS — K219 Gastro-esophageal reflux disease without esophagitis: Secondary | ICD-10-CM | POA: Diagnosis present

## 2016-02-09 DIAGNOSIS — F319 Bipolar disorder, unspecified: Secondary | ICD-10-CM | POA: Diagnosis present

## 2016-02-09 DIAGNOSIS — N179 Acute kidney failure, unspecified: Secondary | ICD-10-CM | POA: Diagnosis present

## 2016-02-09 DIAGNOSIS — A419 Sepsis, unspecified organism: Principal | ICD-10-CM | POA: Diagnosis present

## 2016-02-09 DIAGNOSIS — R6520 Severe sepsis without septic shock: Secondary | ICD-10-CM | POA: Diagnosis present

## 2016-02-09 DIAGNOSIS — F17210 Nicotine dependence, cigarettes, uncomplicated: Secondary | ICD-10-CM | POA: Diagnosis present

## 2016-02-09 HISTORY — DX: Opioid abuse, uncomplicated: F11.10

## 2016-02-09 HISTORY — DX: Abnormal electrocardiogram (ECG) (EKG): R94.31

## 2016-02-09 HISTORY — DX: Other psychoactive substance abuse, uncomplicated: F19.10

## 2016-02-09 LAB — CBC WITH DIFFERENTIAL/PLATELET
BASOS PCT: 0 %
Basophils Absolute: 0 10*3/uL (ref 0.0–0.1)
EOS PCT: 0 %
Eosinophils Absolute: 0 10*3/uL (ref 0.0–0.7)
HCT: 42.2 % (ref 36.0–46.0)
HEMOGLOBIN: 14.3 g/dL (ref 12.0–15.0)
Lymphocytes Relative: 4 %
Lymphs Abs: 1.1 10*3/uL (ref 0.7–4.0)
MCH: 26.8 pg (ref 26.0–34.0)
MCHC: 33.9 g/dL (ref 30.0–36.0)
MCV: 79.2 fL (ref 78.0–100.0)
MONO ABS: 3 10*3/uL — AB (ref 0.1–1.0)
Monocytes Relative: 11 %
NEUTROS ABS: 23.3 10*3/uL — AB (ref 1.7–7.7)
NEUTROS PCT: 85 %
Platelets: 222 10*3/uL (ref 150–400)
RBC: 5.33 MIL/uL — ABNORMAL HIGH (ref 3.87–5.11)
RDW: 14.1 % (ref 11.5–15.5)
WBC MORPHOLOGY: INCREASED
WBC: 27.4 10*3/uL — ABNORMAL HIGH (ref 4.0–10.5)

## 2016-02-09 LAB — COMPREHENSIVE METABOLIC PANEL
ALBUMIN: 3.7 g/dL (ref 3.5–5.0)
ALK PHOS: 100 U/L (ref 38–126)
ALT: 24 U/L (ref 14–54)
AST: 28 U/L (ref 15–41)
Anion gap: 17 — ABNORMAL HIGH (ref 5–15)
BILIRUBIN TOTAL: 0.9 mg/dL (ref 0.3–1.2)
BUN: 38 mg/dL — AB (ref 6–20)
CALCIUM: 9 mg/dL (ref 8.9–10.3)
CO2: 19 mmol/L — ABNORMAL LOW (ref 22–32)
Chloride: 94 mmol/L — ABNORMAL LOW (ref 101–111)
Creatinine, Ser: 1.33 mg/dL — ABNORMAL HIGH (ref 0.44–1.00)
GFR calc Af Amer: >60 mL/min (ref >60)
GFR calc non Af Amer: 52 mL/min — ABNORMAL LOW (ref >60)
GLUCOSE: 109 mg/dL — AB (ref 65–99)
Potassium: 3.2 mmol/L — ABNORMAL LOW (ref 3.5–5.1)
Sodium: 130 mmol/L — ABNORMAL LOW (ref 135–145)
TOTAL PROTEIN: 8.1 g/dL (ref 6.5–8.1)

## 2016-02-09 LAB — I-STAT BETA HCG BLOOD, ED (MC, WL, AP ONLY)

## 2016-02-09 LAB — URINALYSIS, ROUTINE W REFLEX MICROSCOPIC
Bilirubin Urine: NEGATIVE
Glucose, UA: NEGATIVE mg/dL
KETONES UR: 20 mg/dL — AB
Nitrite: NEGATIVE
PROTEIN: 100 mg/dL — AB
Specific Gravity, Urine: 1.018 (ref 1.005–1.030)
pH: 5 (ref 5.0–8.0)

## 2016-02-09 LAB — I-STAT TROPONIN, ED: Troponin i, poc: 0.03 ng/mL (ref 0.00–0.08)

## 2016-02-09 LAB — I-STAT CG4 LACTIC ACID, ED
LACTIC ACID, VENOUS: 2.76 mmol/L — AB (ref 0.5–1.9)
Lactic Acid, Venous: 3.14 mmol/L (ref 0.5–1.9)

## 2016-02-09 MED ORDER — SODIUM CHLORIDE 0.9 % IV BOLUS (SEPSIS)
500.0000 mL | Freq: Once | INTRAVENOUS | Status: AC
  Administered 2016-02-09: 500 mL via INTRAVENOUS

## 2016-02-09 MED ORDER — VANCOMYCIN HCL IN DEXTROSE 1-5 GM/200ML-% IV SOLN
1000.0000 mg | Freq: Once | INTRAVENOUS | Status: AC
Start: 2016-02-09 — End: 2016-02-09
  Administered 2016-02-09: 1000 mg via INTRAVENOUS
  Filled 2016-02-09: qty 200

## 2016-02-09 MED ORDER — SODIUM CHLORIDE 0.9 % IV BOLUS (SEPSIS)
1000.0000 mL | Freq: Once | INTRAVENOUS | Status: AC
  Administered 2016-02-09: 1000 mL via INTRAVENOUS

## 2016-02-09 MED ORDER — PIPERACILLIN-TAZOBACTAM 3.375 G IVPB 30 MIN
3.3750 g | Freq: Once | INTRAVENOUS | Status: AC
  Administered 2016-02-09: 3.375 g via INTRAVENOUS
  Filled 2016-02-09: qty 50

## 2016-02-09 MED ORDER — VANCOMYCIN HCL 500 MG IV SOLR
500.0000 mg | Freq: Two times a day (BID) | INTRAVENOUS | Status: DC
  Administered 2016-02-10 – 2016-02-11 (×3): 500 mg via INTRAVENOUS
  Filled 2016-02-09 (×5): qty 500

## 2016-02-09 MED ORDER — ACETAMINOPHEN 325 MG PO TABS
650.0000 mg | ORAL_TABLET | Freq: Once | ORAL | Status: AC
  Administered 2016-02-09: 650 mg via ORAL
  Filled 2016-02-09: qty 2

## 2016-02-09 MED ORDER — PIPERACILLIN-TAZOBACTAM 3.375 G IVPB: 3.3750 g | Freq: Three times a day (TID) | INTRAVENOUS | Status: DC

## 2016-02-09 MED ORDER — IOPAMIDOL (ISOVUE-300) INJECTION 61%
INTRAVENOUS | Status: AC
  Administered 2016-02-09: 100 mL
  Filled 2016-02-09: qty 100

## 2016-02-09 MED ORDER — POTASSIUM CHLORIDE CRYS ER 20 MEQ PO TBCR
40.0000 meq | EXTENDED_RELEASE_TABLET | Freq: Once | ORAL | Status: AC
  Administered 2016-02-10: 40 meq via ORAL
  Filled 2016-02-09: qty 2

## 2016-02-09 MED ORDER — SODIUM CHLORIDE 0.9 % IV BOLUS (SEPSIS)
250.0000 mL | Freq: Once | INTRAVENOUS | Status: AC
  Administered 2016-02-09: 250 mL via INTRAVENOUS

## 2016-02-09 NOTE — ED Notes (Signed)
Placed bedside commode at pt's bedside. 

## 2016-02-09 NOTE — ED Notes (Signed)
Elevated I stat lactic reported to Charna ElizabethAudrey RN (nurse first)

## 2016-02-09 NOTE — ED Triage Notes (Signed)
Pt here with urinary frequency and pain in back; pt sts hx of kidney infection with sepsis in past

## 2016-02-09 NOTE — ED Triage Notes (Signed)
Pt did not answer x 1 

## 2016-02-09 NOTE — ED Notes (Signed)
This RN attempted IV x 2.  Patient states normally a hard stick.  IV team consult placed and MD made aware.  Will look when patient returns from xray for US IV

## 2016-02-09 NOTE — ED Provider Notes (Signed)
[facility]-EMERGENCY DEPT Provider Note   CSN: [id number] Arrival date & time: 02/09/16  1740     History   Chief Complaint Chief Complaint  Patient presents with  . Flank Pain    HPI MADELLINE ESHBACH is a 31 y.o. female.  HPI   FAIZA BANSAL is a 31 y.o. female, with a history of Polysubstance abuse and urosepsis, presenting to the ED with Urinary frequency and dysuria beginning 4 days ago. Patient then developed lower back pain, first on the left and then on the right. Patient states she then developed a fever MAXIMUM TEMPERATURE of 103F. Pain in the back is severe, sharp, nonradiating. She also endorses intermittent vomiting. Has been using ibuprofen for her fever. Her last ibuprofen was around 12 PM today. Patient has an active heroin user with her last use around 1 PM today. She states that she uses her arms exclusively for injection of the drugs. Denies other drug use. Denies cough, chest pain, shortness of breath, diarrhea, rashes, skin infections, or any other complaints.      Past Medical History:  Diagnosis Date  . Anxiety   . Bipolar disorder (HCC)   . Childhood asthma   . Depression   . Encounter for Essure implantation 09/2011  . GERD (gastroesophageal reflux disease)   . Heroin abuse   . HSV (herpes simplex virus) infection 2009   ?outbreak could be HSV last pregnancy-not definate diagnosis per pt and chart- pt on Valtrex prophylacttcally  . Migraines    08/19/2015 "4-6 good ones/year"  . Polysubstance abuse   . Prolonged QT interval   . Pyelonephritis     Patient Active Problem List   Diagnosis Date Noted  . Cocaine abuse 08/16/2015  . Pyelonephritis   . Sepsis (HCC)   . 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 DILITATION & CURRETTAGE/HYSTROSCOPY WITH ESSURE  2013    OB History    Gravida Para Term Preterm AB Living   2 2 1 1   2    SAB TAB Ectopic Multiple Live Births           1       Home  Medications    Prior to Admission medications   Medication Sig Start Date End Date Taking? Authorizing Provider  naproxen (NAPROSYN) 500 MG tablet Take 1 tablet (500 mg total) by mouth 2 (two) times daily with a meal. 08/20/15   [name], MD  promethazine (PHENERGAN) 12.5 MG tablet Take 1 tablet (12.5 mg total) by mouth every 6 (six) hours as needed for nausea. 08/20/15 08/27/15  [name], MD    Family History Family History  Problem Relation Age of Onset  . Cancer Other   . Diabetes Other   . Heart failure Other   . Stroke Other     Social History Social History  Substance Use Topics  . Smoking status: Current Every Day Smoker    Packs/day: 0.25    Years: 19.00    Types: Cigarettes  . Smokeless tobacco: Never Used  . Alcohol use No     Allergies   Patient has no known allergies.   Review of Systems Review of Systems  Constitutional: Positive for chills and fever.  HENT: Negative for sore throat.   Respiratory: Negative for cough and shortness of breath.   Cardiovascular: Negative for chest pain.  Gastrointestinal: Positive for nausea and vomiting. Negative for abdominal distention, abdominal pain, blood in stool and diarrhea.  Genitourinary: Positive for dysuria and frequency.  Musculoskeletal: Positive for back pain. Negative for neck pain and neck stiffness.  Neurological: Negative for headaches.  All other systems reviewed and are negative.    Physical Exam Updated Vital Signs BP 103/68 (BP Location: Right Arm)   Pulse (!) 127   Temp 98.3 F (36.8 C) (Oral)   Resp 16   SpO2 100%   Physical Exam  Constitutional: She appears well-developed and well-nourished. No distress.  HENT:  Head: Normocephalic and atraumatic.  Mouth/Throat: Mucous membranes are dry.  Eyes: Conjunctivae and EOM are normal. Pupils are equal, round, and reactive to light.  Neck: Normal range of motion and full passive range of motion without pain. Neck supple. No Brudzinski's sign  and no Kernig's sign noted.  Cardiovascular: Regular rhythm, normal heart sounds and intact distal pulses.  Tachycardia present.   Pulmonary/Chest: Effort normal and breath sounds normal. No respiratory distress.  Abdominal: Soft. Bowel sounds are normal. There is no tenderness. There is CVA tenderness (bilateral). There is no guarding.  Musculoskeletal: She exhibits no edema.  Normal motor function intact in all extremities and spine. No midline spinal tenderness.   Lymphadenopathy:    She has no cervical adenopathy.  Neurological: She is alert.  No sensory deficits. Strength 5/5 in all extremities. No upright ataxia. Coordination intact including heel to shin and finger to nose. Cranial nerves III-XII grossly intact. No facial droop.   Skin: Skin is warm and dry. Capillary refill takes less than 2 seconds. She is not diaphoretic.  Tract marks noted on both anterior upper extremities. No signs of swelling, fluctuance, erythema, streaking, or other signs of infection.  Psychiatric: She has a normal mood and affect. Her behavior is normal.  Nursing note and vitals reviewed.    ED Treatments / Results  Labs (all labs ordered are listed, but only abnormal results are displayed) Labs Reviewed  COMPREHENSIVE METABOLIC PANEL - Abnormal; Notable for the following:       Result Value   Sodium 130 (*)    Potassium 3.2 (*)    Chloride 94 (*)    CO2 19 (*)    Glucose, Bld 109 (*)    BUN 38 (*)    Creatinine, Ser 1.33 (*)    GFR calc non Af Amer 52 (*)    Anion gap 17 (*)    All other components within normal limits  CBC WITH DIFFERENTIAL/PLATELET - Abnormal; Notable for the following:    WBC 27.4 (*)    RBC 5.33 (*)    Neutro Abs 23.3 (*)    Monocytes Absolute 3.0 (*)    All other components within normal limits  URINALYSIS, ROUTINE W REFLEX MICROSCOPIC - Abnormal; Notable for the following:    APPearance HAZY (*)    Hgb urine dipstick MODERATE (*)    Ketones, ur 20 (*)    Protein, ur  100 (*)    Leukocytes, UA TRACE (*)    Bacteria, UA FEW (*)    Squamous Epithelial / LPF 6-30 (*)    Non Squamous Epithelial 0-5 (*)    All other components within normal limits  I-STAT CG4 LACTIC ACID, ED - Abnormal; Notable for the following:    Lactic Acid, Venous 3.14 (*)    All other components within normal limits  I-STAT CG4 LACTIC ACID, ED - Abnormal; Notable for the following:    Lactic Acid, Venous 2.76 (*)    All other components within normal limits  CULTURE, BLOOD (  ROUTINE X 2)  CULTURE, BLOOD (ROUTINE X 2)  URINE CULTURE  I-STAT BETA HCG BLOOD, ED ([facility], [facility], [facility] ONLY)  I-STAT CG4 LACTIC ACID, ED  I-STAT TROPOININ, ED  I-STAT CG4 LACTIC ACID, ED    EKG  EKG Interpretation  Date/Time:  Tuesday February 09 2016 22:38:43 EST Ventricular Rate:  118 PR Interval:    QRS Duration: 89 QT Interval:  378 QTC Calculation: 530 R Axis:   85 Text Interpretation:  Sinus tachycardia Multiform ventricular premature complexes Aberrant complex Probable left atrial enlargement Nonspecific repol abnormality, lateral leads Prolonged QT interval NO STEMI Confirmed by Rush LandmarkEGELER MD, CHRISTOPHER [phone]([id number]) on 02/09/2016 11:58:01 PM       Radiology Dg Chest 2 View  Result Date: 02/09/2016 CLINICAL DATA:  31 y/o  F; sepsis and 4 days of fever. EXAM: CHEST  2 VIEW COMPARISON:  11/06/2013 chest radiograph FINDINGS: Stable heart size and mediastinal contours are within normal limits. Both lungs are clear. The visualized skeletal structures are unremarkable. IMPRESSION: No active cardiopulmonary disease. Electronically Signed   By: Mitzi HansenLance  Furusawa-Stratton M.D.   On: 02/09/2016 22:02   Ct Abdomen Pelvis W Contrast  Result Date: 02/10/2016 CLINICAL DATA:  Low back pain with nausea and vomiting for 4 days. EXAM: CT ABDOMEN AND PELVIS WITH CONTRAST TECHNIQUE: Multidetector CT imaging of the abdomen and pelvis was performed using the standard protocol following bolus administration of intravenous contrast.  CONTRAST:  100 ml  ISOVUE-300 IOPAMIDOL (ISOVUE-300) INJECTION 61% COMPARISON:  None. FINDINGS: Lower chest: Lung bases are clear. Hepatobiliary: Homogeneous liver parenchyma. No focal liver lesions identified. Mild intra and extrahepatic bile duct dilatation. Cause is not determined. No obstructing mass or radiopaque stones identified. No gallstones or gallbladder wall thickening. Pancreas: Unremarkable. No pancreatic ductal dilatation or surrounding inflammatory changes. Spleen: Spleen is enlarged.  No focal lesions identified. Adrenals/Urinary Tract: No adrenal gland nodules. There is diffuse enlargement of the left kidney with heterogeneous nephrogram likely indicating pyelonephritis. Circumscribed low-attenuation zone in the lower pole measuring 3.1 cm diameter. This likely represents focal area of pyelonephritis but an underlying mass is not entirely excluded and followup after resolution of acute process is recommended. No discrete abscess. No hydronephrosis or hydroureter. Right renal nephrogram appears homogeneous. Subcentimeter cyst on the right kidney. Punctate size nonobstructing stones demonstrated throughout the right kidney. Bladder wall is mildly thickened suggesting cystitis. Stomach/Bowel: Stomach and small bowel are mostly decompressed. Stool-filled colon without abnormal distention or free air. Appendix is not identified. Vascular/Lymphatic: No significant vascular findings are present. No enlarged abdominal or pelvic lymph nodes. Reproductive: Uterus and ovaries are not enlarged. Bilateral tubal ligations. Prominent venous structures may indicate pelvic congestion. Other: No free air or free fluid in the abdomen. Abdominal wall musculature appears intact. Musculoskeletal: No acute or significant osseous findings. IMPRESSION: Left kidney is enlarged with heterogeneous nephrogram likely representing pyelonephritis. Bladder wall is thickened, suggesting cystitis. Nonobstructing stones on the right  kidney. Mild intra and extrahepatic bile duct dilatation of nonspecific etiology. Correlation with liver function tests is recommended. Spleen is enlarged. Electronically Signed   By: Burman NievesWilliam  Stevens M.D.   On: 02/10/2016 00:21    Procedures Procedures (including critical care time)  CRITICAL CARE Performed by: Felicita Nuncio C Demian Maisel Total critical care time: 35 minutes Critical care time was exclusive of separately billable procedures and treating other patients. Critical care was necessary to treat or prevent imminent or life-threatening deterioration. Critical care was time spent personally by me on the following activities: development of treatment plan  with patient and/or surrogate as well as nursing, discussions with consultants, evaluation of patient's response to treatment, examination of patient, obtaining history from patient or surrogate, ordering and performing treatments and interventions, ordering and review of laboratory studies, ordering and review of radiographic studies, pulse oximetry and re-evaluation of patient's condition.  Medications Ordered in ED Medications  piperacillin-tazobactam (ZOSYN) IVPB 3.375 g (not administered)  vancomycin (VANCOCIN) 500 mg in sodium chloride 0.9 % 100 mL IVPB (not administered)  morphine 4 MG/ML injection 2 mg (not administered)  sodium chloride 0.9 % bolus 1,000 mL (0 mLs Intravenous Stopped 02/10/16 0018)    And  sodium chloride 0.9 % bolus 500 mL (0 mLs Intravenous Stopped 02/09/16 2323)    And  sodium chloride 0.9 % bolus 250 mL (250 mLs Intravenous New Bag/Given 02/09/16 2246)  vancomycin (VANCOCIN) IVPB 1000 mg/200 mL premix (1,000 mg Intravenous New Bag/Given 02/09/16 2244)  piperacillin-tazobactam (ZOSYN) IVPB 3.375 g (0 g Intravenous Stopped 02/09/16 2323)  acetaminophen (TYLENOL) tablet 650 mg (650 mg Oral Given 02/09/16 2319)  iopamidol (ISOVUE-300) 61 % injection (100 mLs  Contrast Given 02/09/16 2355)  potassium chloride SA (K-DUR,KLOR-CON) CR  tablet 40 mEq (40 mEq Oral Given 02/10/16 0017)     Initial Impression / Assessment and Plan / ED Course  I have reviewed the triage vital signs and the nursing notes.  Pertinent labs & imaging results that were available during my care of the patient were reviewed by me and considered in my medical decision making (see chart for details).     Patient presents with urinary discomfort and lower back pain increasing over the last 4 days. Patient with signs of sepsis. Code sepsis initiated. An infection associated with a renal stone is a possibility, however, a contrast abdominal CT rather than a renal stone study was obtained to better view a renal abscess should it be present. Low suspicion for meningitis or spinal epidural abscess. Patient will need to be admitted for sepsis and while there, may need evaluation for endocarditis, although she is not having chest pain/shortness of breath and no obvious murmur was heard.  12:36 AM Spoke with Dr. Sampson Goon, Family Medicine Resident, who agreed to admit the patient to inpatient stepdown under attending Dr. Lum Babe.   Vitals:   02/09/16 1819 02/09/16 2102 02/09/16 2324 02/09/16 2330  BP: 103/68 117/71 (!) 101/50 102/57  Pulse: (!) 127 99 109 106  Resp: 16 18 11  (!) 8  Temp: 98.3 F (36.8 C) 100.4 F (38 C)    TempSrc: Oral Oral    SpO2: 100% 100% 100% 100%     Final Clinical Impressions(s) / ED Diagnoses   Final diagnoses:  AKI (acute kidney injury) (HCC)  Sepsis, due to unspecified organism [facility])  Pyelonephritis    New Prescriptions New Prescriptions   No medications on file     Anselm Pancoast, PA-C 02/10/16 0038    Canary Brim Tegeler, MD 02/10/16 1118

## 2016-02-09 NOTE — Progress Notes (Signed)
Pharmacy Antibiotic Note  Eileen Torres is a 31 y.o. female admitted on 02/09/2016 with sepsis.  Pharmacy has been consulted for vancomycin and Zosyn dosing. Pt reports flank pain and dysuria, and is an IVDU with last known heroin use today at 1300. Last documented weight = 51.4kg (eGFR ~49 ml/min).  Plan: -Vancomycin 1092m IV x1 -Vancomycin 5069mIV q12h -Zosyn 3.375g IV over 30 min x1 -Zosyn 3.375g IV EI q8h  -Monitor renal function, cultures, LOT, VT as indicated      Temp (24hrs), Avg:99.4 F (37.4 C), Min:98.3 F (36.8 C), Max:100.4 F (38 C)   Recent Labs Lab 02/09/16 1841 02/09/16 1856  WBC 27.4*  --   CREATININE 1.33*  --   LATICACIDVEN  --  3.14*    CrCl cannot be calculated (Unknown ideal weight.).    No Known Allergies  Antimicrobials this admission: 2/6 Zosyn >>  2/6 Vancomycin >>   Dose adjustments this admission: none  Microbiology results: 2/6 BCx: IP 2/6 UCx: IP   Thank you for allowing pharmacy to be a part of this patient's care.  MiArrie SenatePharmD PGY-1 Pharmacy Resident Pager: [phone]/06/2016

## 2016-02-10 DIAGNOSIS — N179 Acute kidney failure, unspecified: Secondary | ICD-10-CM | POA: Insufficient documentation

## 2016-02-10 DIAGNOSIS — N12 Tubulo-interstitial nephritis, not specified as acute or chronic: Secondary | ICD-10-CM

## 2016-02-10 DIAGNOSIS — A419 Sepsis, unspecified organism: Principal | ICD-10-CM

## 2016-02-10 LAB — COMPREHENSIVE METABOLIC PANEL
ALT: 15 U/L (ref 14–54)
ANION GAP: 11 (ref 5–15)
AST: 14 U/L — AB (ref 15–41)
Albumin: 2.4 g/dL — ABNORMAL LOW (ref 3.5–5.0)
Alkaline Phosphatase: 64 U/L (ref 38–126)
BUN: 25 mg/dL — AB (ref 6–20)
CHLORIDE: 102 mmol/L (ref 101–111)
CO2: 22 mmol/L (ref 22–32)
Calcium: 7.6 mg/dL — ABNORMAL LOW (ref 8.9–10.3)
Creatinine, Ser: 0.93 mg/dL (ref 0.44–1.00)
GFR calc Af Amer: >60 mL/min (ref >60)
Glucose, Bld: 104 mg/dL — ABNORMAL HIGH (ref 65–99)
POTASSIUM: 3.3 mmol/L — AB (ref 3.5–5.1)
Sodium: 135 mmol/L (ref 135–145)
Total Bilirubin: 0.7 mg/dL (ref 0.3–1.2)
Total Protein: 5.5 g/dL — ABNORMAL LOW (ref 6.5–8.1)

## 2016-02-10 LAB — RAPID URINE DRUG SCREEN, HOSP PERFORMED
AMPHETAMINES: NOT DETECTED
Barbiturates: NOT DETECTED
Benzodiazepines: NOT DETECTED
COCAINE: POSITIVE — AB
Opiates: POSITIVE — AB
TETRAHYDROCANNABINOL: NOT DETECTED

## 2016-02-10 LAB — CBC
HCT: 28.3 % — ABNORMAL LOW (ref 36.0–46.0)
Hemoglobin: 9.4 g/dL — ABNORMAL LOW (ref 12.0–15.0)
MCH: 26.3 pg (ref 26.0–34.0)
MCHC: 33.2 g/dL (ref 30.0–36.0)
MCV: 79.3 fL (ref 78.0–100.0)
PLATELETS: 176 10*3/uL (ref 150–400)
RBC: 3.57 MIL/uL — ABNORMAL LOW (ref 3.87–5.11)
RDW: 14.1 % (ref 11.5–15.5)
WBC: 17.9 10*3/uL — AB (ref 4.0–10.5)

## 2016-02-10 LAB — MAGNESIUM: MAGNESIUM: 2.2 mg/dL (ref 1.7–2.4)

## 2016-02-10 LAB — MRSA PCR SCREENING: MRSA BY PCR: NEGATIVE

## 2016-02-10 LAB — I-STAT CG4 LACTIC ACID, ED: Lactic Acid, Venous: 1.07 mmol/L (ref 0.5–1.9)

## 2016-02-10 LAB — CK: CK TOTAL: 30 U/L — AB (ref 38–234)

## 2016-02-10 MED ORDER — MORPHINE SULFATE (PF) 4 MG/ML IV SOLN
2.0000 mg | INTRAVENOUS | Status: DC | PRN
  Administered 2016-02-10: 2 mg via INTRAVENOUS
  Filled 2016-02-10: qty 1

## 2016-02-10 MED ORDER — SODIUM CHLORIDE 0.9 % IV BOLUS (SEPSIS)
1000.0000 mL | Freq: Once | INTRAVENOUS | Status: AC
  Administered 2016-02-10: 1000 mL via INTRAVENOUS

## 2016-02-10 MED ORDER — LORAZEPAM 2 MG/ML IJ SOLN
0.5000 mg | Freq: Four times a day (QID) | INTRAMUSCULAR | Status: DC
  Administered 2016-02-10 – 2016-02-11 (×5): 0.5 mg via INTRAVENOUS
  Filled 2016-02-10 (×5): qty 1

## 2016-02-10 MED ORDER — SODIUM CHLORIDE 0.9 % IV SOLN
1.0000 g | Freq: Three times a day (TID) | INTRAVENOUS | Status: DC
  Administered 2016-02-10 – 2016-02-11 (×5): 1 g via INTRAVENOUS
  Filled 2016-02-10 (×8): qty 1

## 2016-02-10 MED ORDER — PROCHLORPERAZINE EDISYLATE 5 MG/ML IJ SOLN
10.0000 mg | INTRAMUSCULAR | Status: DC | PRN
  Administered 2016-02-10 – 2016-02-11 (×2): 10 mg via INTRAVENOUS
  Filled 2016-02-10 (×2): qty 2

## 2016-02-10 MED ORDER — ACETAMINOPHEN 650 MG RE SUPP: 650.0000 mg | Freq: Four times a day (QID) | RECTAL | Status: DC | PRN

## 2016-02-10 MED ORDER — LORAZEPAM 2 MG/ML IJ SOLN: 1.0000 mg | Freq: Once | INTRAMUSCULAR | Status: DC

## 2016-02-10 MED ORDER — LORAZEPAM 2 MG/ML IJ SOLN
1.0000 mg | Freq: Four times a day (QID) | INTRAMUSCULAR | Status: DC | PRN
  Administered 2016-02-11 (×2): 1 mg via INTRAVENOUS
  Filled 2016-02-10 (×2): qty 1

## 2016-02-10 MED ORDER — LOPERAMIDE HCL 2 MG PO CAPS
2.0000 mg | ORAL_CAPSULE | Freq: Once | ORAL | Status: AC
  Administered 2016-02-10: 2 mg via ORAL
  Filled 2016-02-10: qty 1

## 2016-02-10 MED ORDER — LORAZEPAM 2 MG/ML IJ SOLN
2.0000 mg | Freq: Once | INTRAMUSCULAR | Status: DC
  Administered 2016-02-10: 2 mg via INTRAVENOUS
  Filled 2016-02-10: qty 1

## 2016-02-10 MED ORDER — FOLIC ACID 1 MG PO TABS
1.0000 mg | ORAL_TABLET | Freq: Every day | ORAL | Status: DC
  Administered 2016-02-10 – 2016-02-11 (×2): 1 mg via ORAL
  Filled 2016-02-10 (×2): qty 1

## 2016-02-10 MED ORDER — SODIUM CHLORIDE 0.9% FLUSH
3.0000 mL | Freq: Two times a day (BID) | INTRAVENOUS | Status: DC
  Administered 2016-02-10 – 2016-02-11 (×3): 3 mL via INTRAVENOUS

## 2016-02-10 MED ORDER — POTASSIUM CHLORIDE CRYS ER 20 MEQ PO TBCR
40.0000 meq | EXTENDED_RELEASE_TABLET | Freq: Once | ORAL | Status: AC
  Administered 2016-02-10: 40 meq via ORAL
  Filled 2016-02-10: qty 2

## 2016-02-10 MED ORDER — NICOTINE 21 MG/24HR TD PT24
21.0000 mg | MEDICATED_PATCH | Freq: Every day | TRANSDERMAL | Status: DC
  Administered 2016-02-11 (×2): 21 mg via TRANSDERMAL
  Filled 2016-02-10 (×2): qty 1

## 2016-02-10 MED ORDER — ENOXAPARIN SODIUM 40 MG/0.4ML ~~LOC~~ SOLN
40.0000 mg | SUBCUTANEOUS | Status: DC
  Administered 2016-02-10 – 2016-02-11 (×2): 40 mg via SUBCUTANEOUS
  Filled 2016-02-10 (×2): qty 0.4

## 2016-02-10 MED ORDER — MORPHINE SULFATE (PF) 4 MG/ML IV SOLN
2.0000 mg | Freq: Once | INTRAVENOUS | Status: AC
  Administered 2016-02-10: 2 mg via INTRAVENOUS
  Filled 2016-02-10: qty 1

## 2016-02-10 MED ORDER — ACETAMINOPHEN 325 MG PO TABS
650.0000 mg | ORAL_TABLET | Freq: Four times a day (QID) | ORAL | Status: DC | PRN
  Administered 2016-02-10: 650 mg via ORAL
  Filled 2016-02-10: qty 2

## 2016-02-10 MED ORDER — ADULT MULTIVITAMIN W/MINERALS CH
1.0000 | ORAL_TABLET | Freq: Every day | ORAL | Status: DC
  Administered 2016-02-10 – 2016-02-11 (×2): 1 via ORAL
  Filled 2016-02-10 (×2): qty 1

## 2016-02-10 MED ORDER — MORPHINE SULFATE (PF) 4 MG/ML IV SOLN
3.0000 mg | INTRAVENOUS | Status: DC
  Administered 2016-02-10 – 2016-02-11 (×6): 3 mg via INTRAVENOUS
  Filled 2016-02-10 (×6): qty 1

## 2016-02-10 MED ORDER — THIAMINE HCL 100 MG/ML IJ SOLN
100.0000 mg | Freq: Every day | INTRAMUSCULAR | Status: DC
  Administered 2016-02-10: 100 mg via INTRAVENOUS
  Filled 2016-02-10: qty 2

## 2016-02-10 MED ORDER — LORAZEPAM 1 MG PO TABS
1.0000 mg | ORAL_TABLET | Freq: Four times a day (QID) | ORAL | Status: DC | PRN
  Administered 2016-02-10: 1 mg via ORAL
  Filled 2016-02-10: qty 1

## 2016-02-10 MED ORDER — VITAMIN B-1 100 MG PO TABS
100.0000 mg | ORAL_TABLET | Freq: Every day | ORAL | Status: DC
Start: 2016-02-10 — End: 2016-02-11
  Administered 2016-02-11: 100 mg via ORAL
  Filled 2016-02-10: qty 1

## 2016-02-10 MED ORDER — SODIUM CHLORIDE 0.9 % IV SOLN
INTRAVENOUS | Status: DC
  Administered 2016-02-10 (×3): via INTRAVENOUS

## 2016-02-10 NOTE — ED Notes (Signed)
PA at bedside.

## 2016-02-10 NOTE — Progress Notes (Signed)
Pharmacy Antibiotic Note  Eileen Torres is a 31 y.o. female admitted on 02/09/2016 with sepsis.  Pharmacy originally consulted for vancomycin and Zosyn dosing. Pt reports flank pain and dysuria, and is an IVDU. MD now changing Zosyn to Meropenem as urosepsis/pyelonephritis suspected.  Plan: -Continue Vancomycin 500mg  IV q12h -Meropenem 1gm IV q8h -Monitor renal function, cultures, LOT, VT as indicated    Temp (24hrs), Avg:99.1 F (37.3 C), Min:98.3 F (36.8 C), Max:100.4 F (38 C)   Recent Labs Lab 02/09/16 1841 02/09/16 1856 02/09/16 2307  WBC 27.4*  --   --   CREATININE 1.33*  --   --   LATICACIDVEN  --  3.14* 2.76*    CrCl cannot be calculated (Unknown ideal weight.).    No Known Allergies  Antimicrobials this admission: 2/6 Zosyn >> 2/7 2/7 Meropenem>> 2/6 Vancomycin >>   Dose adjustments this admission: none  Microbiology results: 2/6 BCx x2: IP 2/6 UCx: IP   Thank you for allowing pharmacy to be a part of this patient's care.  Eileen Torres, PharmD, BCPS Clinical pharmacist, pager [phone] 02/10/2016

## 2016-02-10 NOTE — Progress Notes (Signed)
FPTS Interim Progress Note  S:Patient feeling nauseous this morning. No acute events overnight.  O: BP 126/96   Pulse 93   Temp 98.7 F (37.1 C) (Oral)   Resp 18   LMP  (LMP Unknown)   SpO2 100%   Gen: Uncomfortable appearing, sitting up in bed EYE: no conjunctival injection, pupils equally round and reactive to light RESPIRATORY: clear to auscultation bilaterally with no wheezes, rhonchi or rales, good effort CV: RRR, no m/r/g, no peripheral edema,  GI: Soft, non-tender, non-distended, normoactive bowel sounds, no hepatosplenomegaly SKIN: warm and dry, no rashes or lesions GU:+CVA tenderness on left NEURO: II-XII grossly intact  A/P: Sepsis 2/2 pyelo  - cont IVF  - cont vanc and meropenem, monitor on IV abx until 24h afebrile and transition to PO - f/u urine and blood cultures - monitor lactic acid (improved at 1.07) - compazine for emesis - pain control with standing morphine 3 mg q4h (18 morphine eq/day)   Drug abuse - monitor for withdrawal - patient questionably using lorazepam per previous BH note, not per patient. Pt admits to heroin and cocaine - ativan 0.5 mg q6h standing to avoid benzo withdrawal - monitor on CIWA - UDS with opiates and cocaine, no benzos  Prolonged QTc - avoid QTc prolonging drugs - monitor on tele  AKI - CK 30 r/o rhabdo  Hypokalemia - replete potassium (3.3) - mag stable 2.2 - monitor BMP  Hyponatremia - 130 on admission, 135 this AM - continue IVF, monitor BMP  Social - SW consult - [facility] discharged  Howard PouchLauren Terryl Molinelli, MD 02/10/2016, 8:37 AM PGY-1, [facility] pager [phone]

## 2016-02-10 NOTE — H&P (Signed)
Family Medicine Teaching [facility] Admission History and Physical Service Pager: [phone]  Patient name: Eileen Torres Medical record number: [id number] Date of birth: 1985/02/25 Age: 31 y.o. Gender: female  Primary Care Provider: [name], MD Consultants: None Code Status: Full (confirmed on admission)  Chief Complaint: Back pain, dysuria, fevers  Assessment and Plan: Eileen Torres is a 31 y.o. female presenting with fever, urinary symptoms and back pain for last 4 days. PMH is significant for IVDA, anxiety, possible bipolar disorder, asthma, and depression.  #Sepsis 2/2 to suspected Pyelonephritis - Patient with flank/pain/urinary symptoms. Febrile to 100.4 in ED but reports fever to 103 F at home. Also with leukocytosis to 27.4. LA elevated at 3.14, improve to 2.76 with IVFs. Tachycardic to 127. BP as low as 91/51, though patient says she also runs low. qSofa score of 1. Meets severe sepsis criteria. Did respond to IVFs. UA with trace leukocytes, moderate hgb, pyuria. CT abdomen/pelvis showed enlarged L kidney and heterogeneity suggestive of pyelo with thickening of bladder wall and nonobstructing stones of R kidney. DDx also includes bacteremia 2/2 IVDA. No murmur appreciated on exam. Notably, was admitted in August 2017 for sepsis 2/2 pyelonephritis. (Cultures at that time grew out only 30,000 colonies/mL E Coli resistant to ampicillin and bactrim.) CXR without infiltrates or consolidations. B-hcg negative.  - Admit Inpatient to SDU with telemetry, Attending Dr. Lum Babe - s/p 1750 L fluid bolus in ED. Another 1 L bolus ordered. - s/p IV ceftriaxone and zosyn in the ED - Plan to continue ceftriaxone but switch zosyn to meropenem given suspected urinary source; narrow as blood/urine cultures allow - Blood cultures x 2 and urine culture obtained prior to abx given, pending - Pain control with tylenol, IV morphine 2 mg q2h PRN  - Monitor fever curve - Trend LA - IV NS125  mL/hr   #History of drug abuse - Admits to last heroine use at 1300 of 2/6. Also uses crack cocaine, xanax, and EtOH. Reports starting to feel tremulousness and anxiety increasing. Would like to discharge to [facility] for detox after this hospitalization. Has recent ED visits requesting detox - Obtain UDS - CIWA protocol - Morphine for pain control and to avoid withdrawal as above  #Prolonged QTc: 530 on admission EKG. Similar to previous.  - Avoid QTc prolonging drugs. - Monitor on telemetry while septic.  - Repeat EKG in a.m. - Will obtain Mg level  #AKI: 1.33 on admission. Patient with poor PO intake, n/v. Suspect prerenal etiology. BL ~ 0.6-0.7.  - Check CK to r/o rhabdo given hgb on UA and drug abuse history - IVFs as above - f/u BMPs  #Hypokalemia: 3.2 on admission.  - s/p 40 mEq kdur in ED - follow up AM BMP  #Hyponatremia: 130 on admission. Suspect in setting of hypovolemia and overstimulation of ADH.  - IVFs as above - Continue to monitor  #Mood disorder: Not on home medications but abuses xanax per Psychiatry note 01/2016. Avoided benzo withdrawal with diazepam 5 mg q6h prn last hospitalization but currently on shortage.  - Schedule ativan 0.5 mg q6h.   #Mild intra and extrahepatic bile duct dilation: Noted on CT. Radiology recommends obtaining LFTs - AST 28, ALT 24  #Asthma: Lungs CTAB on admission. Not on any home therapy.   FEN/GI: regular diet (ADAT) PPx: Lovenox  Disposition: Admitted for IV abx in setting of sepsis. Patient would like to d/c to inpatient rehab.   History of Present Illness:  Eileen Torres is a 31 y.o. female presenting with back pain that began 4 days ago, followed by fever, and now also with nausea/vomiting. She says this feels similar to when she had pyelonephritis last year, which was her first episode. She reports increased urinary frequency, increased darkness of urine, and dysuria. She last had sex just before symptom  onset. She has some abdominal pain but no diarrhea. She has been taking ibuprofen for fever. Appetite has been very low, and pt states she has not had anything to eat for last couple of days. She denies sick contacts. Denies SOB.   In the ED, sepsis protocol was initiated, and patient received 1750 L of NaCl and was started on broad spectrum antibiotics with vancomycin and zosyn.   Review Of Systems: Per HPI with the following additions:   Review of Systems  Constitutional: Positive for chills, fever and malaise/fatigue.  HENT: Negative for congestion, ear pain and sore throat.   Eyes: Negative for blurred vision and pain.  Respiratory: Negative for cough and shortness of breath.   Cardiovascular: Negative for chest pain and leg swelling.  Gastrointestinal: Positive for abdominal pain, nausea and vomiting. Negative for diarrhea.  Genitourinary: Positive for dysuria, flank pain and frequency.  Musculoskeletal: Positive for back pain.  Skin: Negative for rash.  Neurological: Negative for sensory change and focal weakness.  Psychiatric/Behavioral: Positive for substance abuse. Negative for suicidal ideas. The patient is nervous/anxious.     Patient Active Problem List   Diagnosis Date Noted  . Cocaine abuse 08/16/2015  . Pyelonephritis   . Sepsis (HCC)   . Heroin user 01/25/2015    Past Medical History: Past Medical History:  Diagnosis Date  . Anxiety   . Bipolar disorder (HCC)   . Childhood asthma   . Depression   . Encounter for Essure implantation 09/2011  . GERD (gastroesophageal reflux disease)   . Heroin abuse   . HSV (herpes simplex virus) infection 2009   ?outbreak could be HSV last pregnancy-not definate diagnosis per pt and chart- pt on Valtrex prophylacttcally  . Migraines    08/19/2015 "4-6 good ones/year"  . Polysubstance abuse   . Prolonged QT interval   . Pyelonephritis     Past Surgical History: Past Surgical History:  Procedure Laterality Date  .  DILITATION & CURRETTAGE/HYSTROSCOPY WITH ESSURE  2013    Social History: Social History  Substance Use Topics  . Smoking status: Current Every Day Smoker    Packs/day: 0.25    Years: 19.00    Types: Cigarettes  . Smokeless tobacco: Never Used  . Alcohol use No   Additional social history: Lives with her father. History of xanax, cocaine and xanax abuse.  Please also refer to relevant sections of EMR.  Family History: Family History  Problem Relation Age of Onset  . Cancer Other   . Diabetes Other   . Heart failure Other   . Stroke Other     Allergies and Medications: No Known Allergies No current facility-administered medications on file prior to encounter.    Current Outpatient Prescriptions on File Prior to Encounter  Medication Sig Dispense Refill  . naproxen (NAPROSYN) 500 MG tablet Take 1 tablet (500 mg total) by mouth 2 (two) times daily with a meal. 30 tablet 0  . promethazine (PHENERGAN) 12.5 MG tablet Take 1 tablet (12.5 mg total) by mouth every 6 (six) hours as needed for nausea. 30 tablet 0    Objective: BP 102/57   Pulse 106  Temp 100.4 F (38 C) (Oral)   Resp (!) 8   LMP  (LMP Unknown)   SpO2 100%  Exam: General: Thin, ill-appearing young woman with dark under-eye circles. Somewhat uncomfortable appearing.  Eyes: Small but reactive pupils, EOMI ENTM: No nasal congestion. Normal posterior oropharynx. Neck: Supple, FROM. Cardiovascular: Mildly tachycardic, S1, S2, no m/r/g Respiratory: Speaking in complete sentences.  Gastrointestinal: Decreased BS, soft, ND, mildly TTP over epigastric region, not tender over suprapubic region MSK: Bilateral CVA tenderness, L > R. No LE edema.  Derm: Track marks noted on upper extremities. No rashes noted on exposed skin. Neuro: AOx3. No focal deficits. Psych: Flat affect yet anxious.  Labs and Imaging: CBC BMET   Recent Labs Lab 02/09/16 1841  WBC 27.4*  HGB 14.3  HCT 42.2  PLT 222    Recent Labs Lab  02/09/16 1841  NA 130*  K 3.2*  CL 94*  CO2 19*  BUN 38*  CREATININE 1.33*  GLUCOSE 109*  CALCIUM 9.0     Dg Chest 2 View  Result Date: 02/09/2016 CLINICAL DATA:  31 y/o  F; sepsis and 4 days of fever. EXAM: CHEST  2 VIEW COMPARISON:  11/06/2013 chest radiograph FINDINGS: Stable heart size and mediastinal contours are within normal limits. Both lungs are clear. The visualized skeletal structures are unremarkable. IMPRESSION: No active cardiopulmonary disease. Electronically Signed   By: Mitzi HansenLance  Furusawa-Stratton M.D.   On: 02/09/2016 22:02   Ct Abdomen Pelvis W Contrast  Result Date: 02/10/2016 CLINICAL DATA:  Low back pain with nausea and vomiting for 4 days. EXAM: CT ABDOMEN AND PELVIS WITH CONTRAST TECHNIQUE: FINDINGS: Lower chest: Lung bases are clear. Hepatobiliary: Homogeneous liver parenchyma. No focal liver lesions identified. Mild intra and extrahepatic bile duct dilatation. Cause is not determined. No obstructing mass or radiopaque stones identified. No gallstones or gallbladder wall thickening. Pancreas: Unremarkable. No pancreatic ductal dilatation or surrounding inflammatory changes. Spleen: Spleen is enlarged.  No focal lesions identified. Adrenals/Urinary Tract: No adrenal gland nodules. There is diffuse enlargement of the left kidney with heterogeneous nephrogram likely indicating pyelonephritis. Circumscribed low-attenuation zone in the lower pole measuring 3.1 cm diameter. This likely represents focal area of pyelonephritis but an underlying mass is not entirely excluded and followup after resolution of acute process is recommended. No discrete abscess. No hydronephrosis or hydroureter. Right renal nephrogram appears homogeneous. Subcentimeter cyst on the right kidney. Punctate size nonobstructing stones demonstrated throughout the right kidney. Bladder wall is mildly thickened suggesting cystitis. Stomach/Bowel: Stomach and small bowel are mostly decompressed. Stool-filled colon  without abnormal distention or free air. Appendix is not identified. Vascular/Lymphatic: No significant vascular findings are present. No enlarged abdominal or pelvic lymph nodes. Reproductive: Uterus and ovaries are not enlarged. Bilateral tubal ligations. Prominent venous structures may indicate pelvic congestion. Other: No free air or free fluid in the abdomen. Abdominal wall musculature appears intact. Musculoskeletal: No acute or significant osseous findings. IMPRESSION: Left kidney is enlarged with heterogeneous nephrogram likely representing pyelonephritis. Bladder wall is thickened, suggesting cystitis. Nonobstructing stones on the right kidney. Mild intra and extrahepatic bile duct dilatation of nonspecific etiology. Correlation with liver function tests is recommended. Spleen is enlarged. Electronically Signed   By: Burman NievesWilliam  Stevens M.D.   On: 02/10/2016 00:21   Eileen Percell BostonMoen Fitzgerald, MD 02/10/2016, 12:38 AM PGY-2, [facility] FPTS Intern pager: [phone], text pages welcome

## 2016-02-10 NOTE — ED Notes (Signed)
Pt given coca cola per Ruth(RN)

## 2016-02-10 NOTE — ED Notes (Signed)
Pt given ice per Ruth(RN)

## 2016-02-10 NOTE — ED Notes (Signed)
Pt given water per Ruth(RN) 

## 2016-02-11 DIAGNOSIS — F1920 Other psychoactive substance dependence, uncomplicated: Secondary | ICD-10-CM

## 2016-02-11 LAB — BASIC METABOLIC PANEL
Anion gap: 16 — ABNORMAL HIGH (ref 5–15)
BUN: 14 mg/dL (ref 6–20)
CALCIUM: 8.1 mg/dL — AB (ref 8.9–10.3)
CO2: 18 mmol/L — ABNORMAL LOW (ref 22–32)
CREATININE: 0.7 mg/dL (ref 0.44–1.00)
Chloride: 99 mmol/L — ABNORMAL LOW (ref 101–111)
GFR calc Af Amer: >60 mL/min (ref >60)
Glucose, Bld: 70 mg/dL (ref 65–99)
Potassium: 3.4 mmol/L — ABNORMAL LOW (ref 3.5–5.1)
SODIUM: 133 mmol/L — AB (ref 135–145)

## 2016-02-11 LAB — CBC
HCT: 33.8 % — ABNORMAL LOW (ref 36.0–46.0)
Hemoglobin: 11.7 g/dL — ABNORMAL LOW (ref 12.0–15.0)
MCH: 27.2 pg (ref 26.0–34.0)
MCHC: 34.6 g/dL (ref 30.0–36.0)
MCV: 78.6 fL (ref 78.0–100.0)
PLATELETS: 180 10*3/uL (ref 150–400)
RBC: 4.3 MIL/uL (ref 3.87–5.11)
RDW: 14.4 % (ref 11.5–15.5)
WBC: 18 10*3/uL — ABNORMAL HIGH (ref 4.0–10.5)

## 2016-02-11 MED ORDER — POTASSIUM CHLORIDE CRYS ER 20 MEQ PO TBCR
40.0000 meq | EXTENDED_RELEASE_TABLET | Freq: Two times a day (BID) | ORAL | Status: DC
  Administered 2016-02-11: 40 meq via ORAL
  Filled 2016-02-11: qty 2

## 2016-02-11 MED ORDER — LEVOFLOXACIN 250 MG PO TABS: 250.0000 mg | ORAL_TABLET | Freq: Every day | ORAL | 0 refills | Status: DC

## 2016-02-11 MED ORDER — PROCHLORPERAZINE MALEATE 5 MG PO TABS: 5.0000 mg | ORAL_TABLET | Freq: Four times a day (QID) | ORAL | 0 refills | Status: DC | PRN

## 2016-02-11 MED ORDER — TRAZODONE HCL 50 MG PO TABS
50.0000 mg | ORAL_TABLET | Freq: Once | ORAL | Status: AC
  Administered 2016-02-11: 50 mg via ORAL
  Filled 2016-02-11: qty 1

## 2016-02-11 MED ORDER — LOPERAMIDE HCL 2 MG PO CAPS
4.0000 mg | ORAL_CAPSULE | Freq: Once | ORAL | Status: AC
  Administered 2016-02-11: 4 mg via ORAL
  Filled 2016-02-11: qty 2

## 2016-02-11 MED ORDER — MORPHINE SULFATE (PF) 2 MG/ML IV SOLN
2.0000 mg | INTRAVENOUS | Status: DC
  Administered 2016-02-11: 2 mg via INTRAVENOUS
  Filled 2016-02-11: qty 1

## 2016-02-11 NOTE — Progress Notes (Signed)
Family Medicine Teaching Service Daily Progress Note Intern Pager: [phone]  Patient name: Eileen Torres Medical record number: [id number] Date of birth: 10/23/1985 Age: 31 y.o. Gender: female  Primary Care Provider: Howard PouchLauren Feng, MD Consultants: none Code Status: full  Pt Overview and Major Events to Date:  2/7- admitted to FPTS with sepsis 2/2 pyelonephritis   Assessment and Plan: Eileen Torres is a 31 year old female with PMH of substance abuse.   Sepsis 2/2 pyelo- WBC 17.9 > 18.0, BP soft this morning at 87/72. RR 22. Lactic acid 2.76 > 1.06 - cont IVF at 125/hr - cont vanc and meropenem, monitor on IV abx until 24h afebrile and transition to PO - can narrow abx as able - urine cx pending - blood cx pending - pain control with standing morphine 3 mg q4h (18 morphine eq/day)- transition to PO when patient can tolerate oral intake - compazine for nausea/vomiting - loperamide for diarrhea  Drug abuse - monitor for withdrawal - patient questionably using lorazepam per previous BH note, not per patient. Pt admits to heroin and cocaine - ativan 0.5 mg q6h standing to avoid benzo withdrawal- transition to PO as able  - monitor on CIWA  Prolonged QTc - avoid QTc prolonging drugs - monitor on tele  AKI- resolved- creatinine on admission 1.33 > 0.93> 0.70 - CK 30 r/o rhabdo - monitor daily labs  Hypokalemia- improving  K 3.3 > 3.4 - replete potassium with 40 meq k-dur today - mag stable 2.2  - monitor BMP  Hyponatremia- improving - 130 on admission, > 135 > 133 - continue IVF, monitor BMP  Social needs- pt would like to go [facility] after discharged - SW consult  FEN/GI: regular diet, NS at 125/hr PPx: lovenox  Disposition[facility]  Subjective:  Eileen Torres is tired this morning, reports nausea and diarrhea. Has back pain. Cannot eat much without feeling like she will vomit. No fevers or chills.   Objective: Temp:   [98.2 F (36.8 C)-102.5 F (39.2 C)] 98.3 F (36.8 C) (02/08 0338) Pulse Rate:  [58-111] 63 (02/08 0700) Resp:  [13-37] 22 (02/08 0700) BP: (87-121)/(55-86) 87/72 (02/08 0700) SpO2:  [97 %-100 %] 100 % (02/08 0700) Weight:  [113 lb 8 oz (51.5 kg)] 113 lb 8 oz (51.5 kg) (02/07 1810) Physical Exam: General: laying in bed in NAD, sleepy but arousable Cardiovascular: RRR no MRG Respiratory: no increased work of breathing, CTA bilaterally Abdomen: soft, non-distended. +CVA tenderness on right. +BS Extremities: no edema or cyanosis  Laboratory:  Recent Labs Lab 02/09/16 1841 02/10/16 0523 02/11/16 0426  WBC 27.4* 17.9* 18.0*  HGB 14.3 9.4* 11.7*  HCT 42.2 28.3* 33.8*  PLT 222 176 180    Recent Labs Lab 02/09/16 1841 02/10/16 0523 02/11/16 0426  NA 130* 135 133*  K 3.2* 3.3* 3.4*  CL 94* 102 99*  CO2 19* 22 18*  BUN 38* 25* 14  CREATININE 1.33* 0.93 0.70  CALCIUM 9.0 7.6* 8.1*  PROT 8.1 5.5*  --   BILITOT 0.9 0.7  --   ALKPHOS 100 64  --   ALT 24 15  --   AST 28 14*  --   GLUCOSE 109* 104* 70    - UDS with opiates and cocaine, no benzos   Imaging/Diagnostic Tests: No results found.   Tillman SersAngela C Barbara Keng, DO 02/11/2016, 8:02 AM PGY-1, [facility] FPTS Intern pager: [phone], text pages welcome

## 2016-02-11 NOTE — Discharge Summary (Signed)
[facility] Discharge Summary  Patient name: Eileen Torres Medical record number: [id number] Date of birth: Jun 26, 1985 Age: 31 y.o. Gender: female Date of Admission: 02/09/2016  Date of Discharge: 02/11/2016 Admitting Physician: Kinnie Feil, MD  Primary Care Provider: Everrett Coombe, MD Consultants: None  Indication for Hospitalization: Sepsis 2/2 pyelonephritis   Discharge Diagnoses/Problem List:  Active Problems:   Pyelonephritis   Sepsis ([place])   Polysubstance (including opioids) dependence with physiological dependence ([place])  Disposition: Home, plans to check into rehab   Discharge Condition: Improved  Discharge Exam:  Blood pressure (!) 106/47, pulse (!) 57, temperature 98.5 F (36.9 C), temperature source Oral, resp. rate (!) 26, height 5' 3"  (1.6 m), weight 113 lb 8 oz (51.5 kg), SpO2 99 %. General: lying in bed in NAD, sleepy but arousable Cardiovascular: RRR no MRG Respiratory: no increased work of breathing, CTA bilaterally Abdomen: soft, non-distended. +CVA tenderness on right. +BS Extremities: no edema or cyanosis  Brief Hospital Course:  Eileen Torres is a 31 y.o. female who presented with fever, urinary symptoms and worsening back pain x 4 days. PMH is significant for IVDA, anxiety, possible bipolar disorder, asthma, and depression. Notably, was admitted in August 2017 for sepsis 2/2 pyelonephritis.  Sepsis 2/2 Pyelonephritis: She met sepsis criteria in the Emergency Room and was started on broad spectrum antibiotics (ceftriaxone and zosyn). Vital signs improved with IVFs and antibiotics. UA showed trace leukocytes, moderate hgb, pyuria. CT abdomen/pelvis showed very enlarged L kidney and heterogeneity suggestive of pyelo with thickening of bladder wall and nonobstructing stones of R kidney. Differential diagnosis also included bacteremia 2/2 IVDA. No murmur appreciated on exam. Zosyn was switched to meropenem given suspected urinary  source. Urine culture grew Gram Negative Rods but had not yet been speciated by time of discharge. Plan was to transition patient to PO antibiotics once urine culture sensitivities returned, but on day of discharge, patient requested to  leave because her grandmother was actively dying. She had been afebrile for nearly 24 hours and was planning on leaving AMA otherwise, so she was discharged on levaquin 250 mg x 10 days to treat pyelonephritis. Close follow-up and strict return precautions were provided. She stated that she plans to enroll in rehab for IV drug abuse once family emergency has passed.   Drug Abuse: History of heroin, cocaine and xanax abuse. Most recent drug use was heroin the day of admission. Patient was given morphine 3 mg q4h IV and ativan 0.5 mg q6h to prevent symptoms of opioid and benzodiazepine withdrawal. Plan was to continue to decrease doses of both, but patient left before taper was begun. She was not discharged with prescriptions for either, as her plan was to enter a detox program. CSW provided information about various programs prior to patient's discharge.   Issues for Follow Up:  1. Will need repeat kidney imaging to rule-out process other than infection given possible mass 2. Urine culture sensitivities and final blood culture results.  3. Recheck BMP for mild hyponatremia and hypokalemia 4. Patient was discharged on Levaquin 250 mg x10 days for pyelonephritis.  Significant Procedures: None  Significant Labs and Imaging:   Recent Labs Lab 02/09/16 1841 02/10/16 0523 02/11/16 0426  WBC 27.4* 17.9* 18.0*  HGB 14.3 9.4* 11.7*  HCT 42.2 28.3* 33.8*  PLT 222 176 180    Recent Labs Lab 02/09/16 1841 02/10/16 0523 02/11/16 0426  NA 130* 135 133*  K 3.2* 3.3* 3.4*  CL 94* 102 99*  CO2 19* 22 18*  GLUCOSE 109* 104* 70  BUN 38* 25* 14  CREATININE 1.33* 0.93 0.70  CALCIUM 9.0 7.6* 8.1*  MG  --  2.2  --   ALKPHOS 100 64  --   AST 28 14*  --   ALT 24 15   --   ALBUMIN 3.7 2.4*  --    Dg Chest 2 View  Result Date: 02/09/2016 CLINICAL DATA:  31 y/o  F; sepsis and 4 days of fever. EXAM: CHEST  2 VIEW COMPARISON:  11/06/2013 chest radiograph FINDINGS: Stable heart size and mediastinal contours are within normal limits. Both lungs are clear. The visualized skeletal structures are unremarkable. IMPRESSION: No active cardiopulmonary disease. Electronically Signed   By: Kristine Garbe M.D.   On: 02/09/2016 22:02   Ct Abdomen Pelvis W Contrast  Result Date: 02/10/2016 CLINICAL DATA:  Low back pain with nausea and vomiting for 4 days. EXAM: CT ABDOMEN AND PELVIS WITH CONTRAST TECHNIQUE: Multidetector CT imaging of the abdomen and pelvis was performed using the standard protocol following bolus administration of intravenous contrast. CONTRAST:  100 ml  ISOVUE-300 IOPAMIDOL (ISOVUE-300) INJECTION 61% COMPARISON:  None. FINDINGS: Lower chest: Lung bases are clear. Hepatobiliary: Homogeneous liver parenchyma. No focal liver lesions identified. Mild intra and extrahepatic bile duct dilatation. Cause is not determined. No obstructing mass or radiopaque stones identified. No gallstones or gallbladder wall thickening. Pancreas: Unremarkable. No pancreatic ductal dilatation or surrounding inflammatory changes. Spleen: Spleen is enlarged.  No focal lesions identified. Adrenals/Urinary Tract: No adrenal gland nodules. There is diffuse enlargement of the left kidney with heterogeneous nephrogram likely indicating pyelonephritis. Circumscribed low-attenuation zone in the lower pole measuring 3.1 cm diameter. This likely represents focal area of pyelonephritis but an underlying mass is not entirely excluded and followup after resolution of acute process is recommended. No discrete abscess. No hydronephrosis or hydroureter. Right renal nephrogram appears homogeneous. Subcentimeter cyst on the right kidney. Punctate size nonobstructing stones demonstrated throughout the right  kidney. Bladder wall is mildly thickened suggesting cystitis. Stomach/Bowel: Stomach and small bowel are mostly decompressed. Stool-filled colon without abnormal distention or free air. Appendix is not identified. Vascular/Lymphatic: No significant vascular findings are present. No enlarged abdominal or pelvic lymph nodes. Reproductive: Uterus and ovaries are not enlarged. Bilateral tubal ligations. Prominent venous structures may indicate pelvic congestion. Other: No free air or free fluid in the abdomen. Abdominal wall musculature appears intact. Musculoskeletal: No acute or significant osseous findings. IMPRESSION: Left kidney is enlarged with heterogeneous nephrogram likely representing pyelonephritis. Bladder wall is thickened, suggesting cystitis. Nonobstructing stones on the right kidney. Mild intra and extrahepatic bile duct dilatation of nonspecific etiology. Correlation with liver function tests is recommended. Spleen is enlarged. Electronically Signed   By: Lucienne Capers M.D.   On: 02/10/2016 00:21   Results/Tests Pending at Time of Discharge:  Urine culture collected 02/09/16 Blood culture drawn 02/09/16  Discharge Medications:  Allergies as of 02/11/2016   No Known Allergies     Medication List    TAKE these medications   levofloxacin 250 MG tablet Commonly known as:  LEVAQUIN Take 1 tablet (250 mg total) by mouth daily.   prochlorperazine 5 MG tablet Commonly known as:  COMPAZINE Take 1 tablet (5 mg total) by mouth every 6 (six) hours as needed for nausea or vomiting.       Discharge Instructions: Please refer to Patient Instructions section of EMR for full details.  Patient was counseled important signs and symptoms that should prompt  return to medical care, changes in medications, dietary instructions, activity restrictions, and follow up appointments.   Follow-Up Appointments: Follow-up Information    Mercy Riding, MD. Go on 02/17/2016.   Specialty:  Family Medicine Why:   8:45 a.m. for hospital follow-up appointment Contact information: [place] [id number] [phone]           Everrett Coombe, MD 02/11/2016, 10:20 PM PGY-1, [place]

## 2016-02-11 NOTE — Discharge Instructions (Signed)
Ms. Eileen Torres,  You were hospitalized for serious infection of the kidney. Please continue antibiotics for the next 10 days. You will need follow-up imaging of your kidney.  If you have fever, can't keep your antibiotics down, or worsening pain, please return to Emergency Department.   We will call you if the urine culture shows you will need a different antibiotic.  Thank you for letting us take part in your care.    Pyelonephritis, Adult Introduction Pyelonephritis is a kidney infection. The kidneys are organs that help clean your blood by moving waste out of your blood and into your pee (urine). This infection can happen quickly, or it can last for a long time. In most cases, it clears up with treatment and does not cause other problems. Follow these instructions at home: Medicines  Take over-the-counter and prescription medicines only as told by your doctor.  Take your antibiotic medicine as told by your doctor. Do not stop taking the medicine even if you start to feel better. General instructions  Drink enough fluid to keep your pee clear or pale yellow.  Avoid caffeine, tea, and carbonated drinks.  Pee (urinate) often. Avoid holding in pee for long periods of time.  Pee before and after sex.  After pooping (having a bowel movement), women should wipe from front to back. Use each tissue only once.  Keep all follow-up visits as told by your doctor. This is important. Contact a doctor if:  You do not feel better after 2 days.  Your symptoms get worse.  You have a fever. Get help right away if:  You cannot take your medicine or drink fluids as told.  You have chills and shaking.  You throw up (vomit).  You have very bad pain in your side (flank) or back.  You feel very weak or you pass out (faint). This information is not intended to replace advice given to you by your health care provider. Make sure you discuss any questions you have with your health care  provider. Document Released: 01/28/2004 Document Revised: 05/28/2015 Document Reviewed: 04/14/2014  2017 Elsevier
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
## 2016-02-11 NOTE — Progress Notes (Signed)
CSW met with pt at bedside to discuss rehab options.  CSW explained that she would NOT be able to transfer to [facility] for detox once DC'd from hospital because they would have to accept patient before detox symptoms begin.  Pt states she has been in inpatient rehab in the past in [place] and in [place] and would be agreeable to trying other facilities for rehab.  CSW provided resources for inpatient programs and explained that she would need to set up an in person appointment to meet with rehab and be screened for admission.  CSW also provided pt with list of methadone/suboxone clinics in case patient would benefit after she finishes inpatient rehab- pt states this has been unaffordable in the past $14/day and didn't prevent her from continuing her drug use so she would prefer to rehab without assistance from drugs.  Pt reports no other questions or concerns- CSW signing off  Jorge Ny, [place] Social Worker [phone]

## 2016-02-12 ENCOUNTER — Telehealth: Payer: Self-pay | Admitting: *Deleted

## 2016-02-12 LAB — URINE CULTURE

## 2016-02-12 MED FILL — PROCHLORPERAZINE 5 MG TAB: 5 | 4 days supply | Qty: 15 | Fill #0

## 2016-02-12 MED FILL — levoFLOXacin 250 MG TABS: 250 | 10 days supply | Qty: 10 | Fill #0

## 2016-02-12 NOTE — Telephone Encounter (Signed)
Attempted to reach patient to learn if she is taking antibiotic as prescribed. Left HIPAA compliant message on patient's VM to return call. Kinnie FeilL. Ducatte, RN, BSN

## 2016-02-14 LAB — CULTURE, BLOOD (ROUTINE X 2): CULTURE: NO GROWTH

## 2016-02-15 LAB — CULTURE, BLOOD (ROUTINE X 2): CULTURE: NO GROWTH

## 2016-02-15 NOTE — Telephone Encounter (Signed)
Second attempt to reach patient to learn if she is taking her antibiotic. Left HIPAA compliant message on patient's VM to return call. Kinnie FeilL. Ducatte, RN, BSN

## 2016-02-16 NOTE — Telephone Encounter (Signed)
Third attempt to reach patient. Left message with appt reminder for tomorrow at 0845. Mobile number has VM that has not been set up. Kinnie FeilL. Malikhi Ogan, RN, BSN

## 2016-02-17 ENCOUNTER — Inpatient Hospital Stay: Payer: Self-pay | Admitting: Student

## 2016-06-03 DIAGNOSIS — N179 Acute kidney failure, unspecified: Secondary | ICD-10-CM

## 2016-06-03 HISTORY — DX: Acute kidney failure, unspecified: N17.9

## 2016-06-05 ENCOUNTER — Inpatient Hospital Stay (HOSPITAL_COMMUNITY)
Admission: EM | Admit: 2016-06-05 | Discharge: 2016-06-09 | DRG: 683 | Attending: Family Medicine | Admitting: Family Medicine

## 2016-06-05 ENCOUNTER — Encounter (HOSPITAL_COMMUNITY): Payer: Self-pay | Admitting: Emergency Medicine

## 2016-06-05 DIAGNOSIS — F1123 Opioid dependence with withdrawal: Secondary | ICD-10-CM | POA: Diagnosis present

## 2016-06-05 DIAGNOSIS — N179 Acute kidney failure, unspecified: Secondary | ICD-10-CM | POA: Diagnosis not present

## 2016-06-05 DIAGNOSIS — R0789 Other chest pain: Secondary | ICD-10-CM | POA: Diagnosis present

## 2016-06-05 DIAGNOSIS — F10231 Alcohol dependence with withdrawal delirium: Secondary | ICD-10-CM | POA: Diagnosis present

## 2016-06-05 DIAGNOSIS — E861 Hypovolemia: Secondary | ICD-10-CM | POA: Diagnosis present

## 2016-06-05 DIAGNOSIS — E860 Dehydration: Secondary | ICD-10-CM | POA: Diagnosis not present

## 2016-06-05 DIAGNOSIS — Z79899 Other long term (current) drug therapy: Secondary | ICD-10-CM

## 2016-06-05 DIAGNOSIS — E876 Hypokalemia: Secondary | ICD-10-CM | POA: Diagnosis present

## 2016-06-05 DIAGNOSIS — F1193 Opioid use, unspecified with withdrawal: Secondary | ICD-10-CM

## 2016-06-05 DIAGNOSIS — J45909 Unspecified asthma, uncomplicated: Secondary | ICD-10-CM | POA: Diagnosis present

## 2016-06-05 DIAGNOSIS — K219 Gastro-esophageal reflux disease without esophagitis: Secondary | ICD-10-CM | POA: Diagnosis present

## 2016-06-05 DIAGNOSIS — F1010 Alcohol abuse, uncomplicated: Secondary | ICD-10-CM

## 2016-06-05 HISTORY — DX: Acute kidney failure, unspecified: N17.9

## 2016-06-05 MED ORDER — ONDANSETRON HCL 4 MG/2ML IJ SOLN
4.0000 mg | Freq: Once | INTRAMUSCULAR | Status: AC
  Administered 2016-06-06: 4 mg via INTRAVENOUS
  Filled 2016-06-05: qty 2

## 2016-06-05 MED ORDER — KETOROLAC TROMETHAMINE 30 MG/ML IJ SOLN
30.0000 mg | Freq: Once | INTRAMUSCULAR | Status: AC
  Administered 2016-06-06: 30 mg via INTRAVENOUS
  Filled 2016-06-05: qty 1

## 2016-06-05 MED ORDER — CLONIDINE HCL 0.1 MG PO TABS
0.1000 mg | ORAL_TABLET | Freq: Once | ORAL | Status: AC
  Administered 2016-06-06: 0.1 mg via ORAL
  Filled 2016-06-05: qty 1

## 2016-06-05 MED ORDER — SODIUM CHLORIDE 0.9 % IV BOLUS (SEPSIS)
1000.0000 mL | Freq: Once | INTRAVENOUS | Status: AC
  Administered 2016-06-06: 1000 mL via INTRAVENOUS

## 2016-06-05 NOTE — ED Provider Notes (Signed)
[facility]-EMERGENCY DEPT Provider Note   CSN: [id number] Arrival date & time: 06/05/16  2339  By signing my name below, I, [name], attest that this documentation has been prepared under the direction and in the presence of physician practitioner, Azalia Bilis, MD. Electronically Signed: [name], Scribe. 06/05/2016. 11:57 PM.  History   Chief Complaint Chief Complaint  Patient presents with  . Withdrawal  . Chest Pain   The history is provided by the patient and the police. No language interpreter was used.    HPI Comments: ANNELLA Torres is a currently incarcerated 31 y.o. female with PMHx including anxiety, depression, polysubstance abuse, and GERD who presents to the Emergency Department via police complaining of persistent central chest pain beginning yesterday. She reports some associated left-sided abdominal pain along with nausea and vomiting. She states she has been regurgitating almost all PO intake. Patient believes her symptoms may be due to heroin and alcohol withdrawal; she last used these substances yesterday shortly prior to going to jail. She denies dyspnea, numbness/tingling, or any other associated symptoms.  Past Medical History:  Diagnosis Date  . Anxiety   . Bipolar disorder (HCC)   . Childhood asthma   . Depression   . Encounter for Essure implantation 09/2011  . GERD (gastroesophageal reflux disease)   . Heroin abuse   . HSV (herpes simplex virus) infection 2009   ?outbreak could be HSV last pregnancy-not definate diagnosis per pt and chart- pt on Valtrex prophylacttcally  . Migraines    08/19/2015 "4-6 good ones/year"  . Polysubstance abuse   . Prolonged QT interval   . Pyelonephritis     Patient Active Problem List   Diagnosis Date Noted  . Polysubstance (including opioids) dependence with physiological dependence (HCC)   . AKI (acute kidney injury) (HCC)   . Cocaine abuse 08/16/2015  . Pyelonephritis   . Sepsis (HCC)   . Heroin user  01/25/2015    Past Surgical History:  Procedure Laterality Date  . DILITATION & CURRETTAGE/HYSTROSCOPY WITH ESSURE  2013    OB History    Gravida Para Term Preterm AB Living   2 2 1 1   2    SAB TAB Ectopic Multiple Live Births           1       Home Medications    Prior to Admission medications   Medication Sig Start Date End Date Taking? Authorizing Provider  levofloxacin (LEVAQUIN) 250 MG tablet Take 1 tablet (250 mg total) by mouth daily. 02/11/16   Casey Burkitt, MD  prochlorperazine (COMPAZINE) 5 MG tablet Take 1 tablet (5 mg total) by mouth every 6 (six) hours as needed for nausea or vomiting. 02/11/16   Casey Burkitt, MD    Family History Family History  Problem Relation Age of Onset  . Cancer Other   . Diabetes Other   . Heart failure Other   . Stroke Other     Social History Social History  Substance Use Topics  . Smoking status: Current Every Day Smoker    Packs/day: 0.25    Years: 19.00    Types: Cigarettes  . Smokeless tobacco: Never Used  . Alcohol use No     Allergies   Patient has no known allergies.   Review of Systems Review of Systems All other systems reviewed and are negative for acute change except as noted in the HPI. Physical Exam Updated Vital Signs BP 125/85 (BP Location: Right Arm)   Pulse 83  Temp 97.8 F (36.6 C) (Oral)   Resp (!) 37   Ht 5\' 3"  (1.6 m)   Wt 110 lb (49.9 kg)   SpO2 96%   BMI 19.49 kg/m   Physical Exam  Constitutional: She is oriented to person, place, and time. She appears well-developed and well-nourished. No distress.  HENT:  Head: Normocephalic and atraumatic.  Eyes: EOM are normal.  Neck: Normal range of motion.  Cardiovascular: Normal rate, regular rhythm and normal heart sounds.   Pulmonary/Chest: Effort normal and breath sounds normal.  Abdominal: Soft. She exhibits no distension. There is no tenderness.  Musculoskeletal: Normal range of motion.  Neurological: She is alert  and oriented to person, place, and time.  Skin: Skin is warm and dry.  Psychiatric: She has a normal mood and affect. Judgment normal.  Nursing note and vitals reviewed.  ED Treatments / Results  Labs (all labs ordered are listed, but only abnormal results are displayed) Labs Reviewed  BASIC METABOLIC PANEL - Abnormal; Notable for the following:       Result Value   Potassium 2.9 (*)    Chloride 88 (*)    Glucose, Bld 129 (*)    BUN 61 (*)    Creatinine, Ser 2.53 (*)    GFR calc non Af Amer 24 (*)    GFR calc Af Amer 28 (*)    Anion gap 20 (*)    All other components within normal limits  CBC - Abnormal; Notable for the following:    WBC 23.2 (*)    RBC 6.15 (*)    Hemoglobin 16.8 (*)    HCT 48.2 (*)    Platelets 411 (*)    All other components within normal limits  URINALYSIS, ROUTINE W REFLEX MICROSCOPIC  PREGNANCY, URINE  I-STAT TROPOININ, ED   BUN  Date Value Ref Range Status  06/06/2016 61 (H) 6 - 20 mg/dL Final  45/40/981102/08/2016 14 6 - 20 mg/dL Final  91/47/829502/07/2016 25 (H) 6 - 20 mg/dL Final  62/13/086502/06/2016 38 (H) 6 - 20 mg/dL Final   Creat  Date Value Ref Range Status  08/28/2015 0.98 0.50 - 1.10 mg/dL Final   Creatinine, Ser  Date Value Ref Range Status  06/06/2016 2.53 (H) 0.44 - 1.00 mg/dL Final  78/46/962902/08/2016 5.280.70 0.44 - 1.00 mg/dL Final  41/32/440102/07/2016 0.270.93 0.44 - 1.00 mg/dL Final  25/36/644002/06/2016 3.471.33 (H) 0.44 - 1.00 mg/dL Final      EKG  EKG Interpretation None       Radiology No results found.  Procedures Procedures (including critical care time)  DIAGNOSTIC STUDIES: Oxygen Saturation is 96% on RA, adequate by my interpretation.    COORDINATION OF CARE: 11:55 PM Discussed treatment plan with pt at bedside and pt agreed to plan.  Medications Ordered in ED Medications  sodium chloride 0.9 % bolus 1,000 mL (not administered)  ondansetron (ZOFRAN) injection 4 mg (4 mg Intravenous Given 06/06/16 0026)  sodium chloride 0.9 % bolus 1,000 mL (1,000 mLs  Intravenous New Bag/Given 06/06/16 0026)  cloNIDine (CATAPRES) tablet 0.1 mg (0.1 mg Oral Given 06/06/16 0026)  ketorolac (TORADOL) 30 MG/ML injection 30 mg (30 mg Intravenous Given 06/06/16 0026)     Initial Impression / Assessment and Plan / ED Course  I have reviewed the triage vital signs and the nursing notes.  Pertinent labs & imaging results that were available during my care of the patient were reviewed by me and considered in my medical decision making (see chart for details).  Patient with hypokalemia and acute renal failure likely secondary to dehydration from her vomiting.  Her vomiting is likely secondary to withdrawal.  No indication for imaging of her abdomen at this time.  Urine samples will be obtained.Admission for acute renal failure and ongoing IV hydration and replacement of her potassium.  Final Clinical Impressions(s) / ED Diagnoses   Final diagnoses:  None    New Prescriptions New Prescriptions   No medications on file   I personally performed the services described in this documentation, which was scribed in my presence. The recorded information has been reviewed and is accurate.       Azalia Bilis, MD 06/06/16 [phone]

## 2016-06-05 NOTE — ED Triage Notes (Signed)
[facility]TOH and heroin withdraw. Pt states her last use of both was on Saturday morning. Currently reports mid sternal chest pain, continuous nausea and vomiting. Pt states hx of seizures with detox.

## 2016-06-05 NOTE — ED Notes (Signed)
ED Provider at bedside. 

## 2016-06-06 ENCOUNTER — Emergency Department (HOSPITAL_COMMUNITY)

## 2016-06-06 DIAGNOSIS — N179 Acute kidney failure, unspecified: Secondary | ICD-10-CM | POA: Diagnosis present

## 2016-06-06 DIAGNOSIS — F1123 Opioid dependence with withdrawal: Secondary | ICD-10-CM

## 2016-06-06 DIAGNOSIS — E861 Hypovolemia: Secondary | ICD-10-CM | POA: Diagnosis present

## 2016-06-06 DIAGNOSIS — E860 Dehydration: Secondary | ICD-10-CM

## 2016-06-06 DIAGNOSIS — F1193 Opioid use, unspecified with withdrawal: Secondary | ICD-10-CM

## 2016-06-06 DIAGNOSIS — E876 Hypokalemia: Secondary | ICD-10-CM | POA: Diagnosis present

## 2016-06-06 DIAGNOSIS — Z79899 Other long term (current) drug therapy: Secondary | ICD-10-CM | POA: Diagnosis not present

## 2016-06-06 DIAGNOSIS — J45909 Unspecified asthma, uncomplicated: Secondary | ICD-10-CM | POA: Diagnosis present

## 2016-06-06 DIAGNOSIS — R0789 Other chest pain: Secondary | ICD-10-CM | POA: Diagnosis present

## 2016-06-06 DIAGNOSIS — K219 Gastro-esophageal reflux disease without esophagitis: Secondary | ICD-10-CM | POA: Diagnosis present

## 2016-06-06 DIAGNOSIS — F1010 Alcohol abuse, uncomplicated: Secondary | ICD-10-CM

## 2016-06-06 DIAGNOSIS — F10231 Alcohol dependence with withdrawal delirium: Secondary | ICD-10-CM | POA: Diagnosis present

## 2016-06-06 LAB — BASIC METABOLIC PANEL
ANION GAP: 20 — AB (ref 5–15)
BUN: 61 mg/dL — AB (ref 6–20)
CHLORIDE: 88 mmol/L — AB (ref 101–111)
CO2: 28 mmol/L (ref 22–32)
Calcium: 9.4 mg/dL (ref 8.9–10.3)
Creatinine, Ser: 2.53 mg/dL — ABNORMAL HIGH (ref 0.44–1.00)
GFR calc Af Amer: 28 mL/min — ABNORMAL LOW (ref >60)
GFR calc non Af Amer: 24 mL/min — ABNORMAL LOW (ref >60)
GLUCOSE: 129 mg/dL — AB (ref 65–99)
POTASSIUM: 2.9 mmol/L — AB (ref 3.5–5.1)
Sodium: 136 mmol/L (ref 135–145)

## 2016-06-06 LAB — COMPREHENSIVE METABOLIC PANEL
ALBUMIN: 3.8 g/dL (ref 3.5–5.0)
ALK PHOS: 100 U/L (ref 38–126)
ALT: 21 U/L (ref 14–54)
AST: 17 U/L (ref 15–41)
Anion gap: 12 (ref 5–15)
BILIRUBIN TOTAL: 0.4 mg/dL (ref 0.3–1.2)
BUN: 54 mg/dL — AB (ref 6–20)
CALCIUM: 7.8 mg/dL — AB (ref 8.9–10.3)
CO2: 28 mmol/L (ref 22–32)
CREATININE: 1.57 mg/dL — AB (ref 0.44–1.00)
Chloride: 98 mmol/L — ABNORMAL LOW (ref 101–111)
GFR calc Af Amer: 49 mL/min — ABNORMAL LOW (ref >60)
GFR calc non Af Amer: 42 mL/min — ABNORMAL LOW (ref >60)
GLUCOSE: 154 mg/dL — AB (ref 65–99)
Potassium: 2.8 mmol/L — ABNORMAL LOW (ref 3.5–5.1)
Sodium: 138 mmol/L (ref 135–145)
TOTAL PROTEIN: 7.5 g/dL (ref 6.5–8.1)

## 2016-06-06 LAB — RAPID URINE DRUG SCREEN, HOSP PERFORMED
AMPHETAMINES: NOT DETECTED
Barbiturates: NOT DETECTED
Benzodiazepines: POSITIVE — AB
Cocaine: POSITIVE — AB
Opiates: POSITIVE — AB
Tetrahydrocannabinol: NOT DETECTED

## 2016-06-06 LAB — CBC
HCT: 38.8 % (ref 36.0–46.0)
HEMATOCRIT: 48.2 % — AB (ref 36.0–46.0)
HEMOGLOBIN: 16.8 g/dL — AB (ref 12.0–15.0)
Hemoglobin: 13.1 g/dL (ref 12.0–15.0)
MCH: 27.3 pg (ref 26.0–34.0)
MCH: 27 pg (ref 26.0–34.0)
MCHC: 33.8 g/dL (ref 30.0–36.0)
MCHC: 34.9 g/dL (ref 30.0–36.0)
MCV: 78.4 fL (ref 78.0–100.0)
MCV: 80 fL (ref 78.0–100.0)
PLATELETS: 338 10*3/uL (ref 150–400)
Platelets: 411 10*3/uL — ABNORMAL HIGH (ref 150–400)
RBC: 6.15 MIL/uL — ABNORMAL HIGH (ref 3.87–5.11)
RBC: 4.85 MIL/uL (ref 3.87–5.11)
RDW: 14 % (ref 11.5–15.5)
RDW: 14.1 % (ref 11.5–15.5)
WBC: 17.7 10*3/uL — ABNORMAL HIGH (ref 4.0–10.5)
WBC: 23.2 10*3/uL — AB (ref 4.0–10.5)

## 2016-06-06 LAB — URINALYSIS, ROUTINE W REFLEX MICROSCOPIC
Bilirubin Urine: NEGATIVE
Glucose, UA: NEGATIVE mg/dL
KETONES UR: NEGATIVE mg/dL
Nitrite: NEGATIVE
PH: 5 (ref 5.0–8.0)
Protein, ur: 100 mg/dL — AB
SPECIFIC GRAVITY, URINE: 1.021 (ref 1.005–1.030)

## 2016-06-06 LAB — I-STAT CG4 LACTIC ACID, ED: Lactic Acid, Venous: 1.96 mmol/L (ref 0.5–1.9)

## 2016-06-06 LAB — MRSA PCR SCREENING: MRSA by PCR: NEGATIVE

## 2016-06-06 LAB — LACTIC ACID, PLASMA: LACTIC ACID, VENOUS: 1 mmol/L (ref 0.5–1.9)

## 2016-06-06 LAB — PREGNANCY, URINE: Preg Test, Ur: NEGATIVE

## 2016-06-06 LAB — I-STAT TROPONIN, ED: Troponin i, poc: 0 ng/mL (ref 0.00–0.08)

## 2016-06-06 LAB — HIV ANTIBODY (ROUTINE TESTING W REFLEX): HIV Screen 4th Generation wRfx: NONREACTIVE

## 2016-06-06 LAB — TROPONIN I

## 2016-06-06 LAB — MAGNESIUM: MAGNESIUM: 2.1 mg/dL (ref 1.7–2.4)

## 2016-06-06 MED ORDER — LORAZEPAM 1 MG PO TABS
1.0000 mg | ORAL_TABLET | Freq: Four times a day (QID) | ORAL | Status: AC | PRN
  Administered 2016-06-07 – 2016-06-09 (×6): 1 mg via ORAL
  Filled 2016-06-06 (×6): qty 1

## 2016-06-06 MED ORDER — ENOXAPARIN SODIUM 40 MG/0.4ML ~~LOC~~ SOLN
40.0000 mg | SUBCUTANEOUS | Status: DC
  Administered 2016-06-07 – 2016-06-09 (×3): 40 mg via SUBCUTANEOUS
  Filled 2016-06-06 (×3): qty 0.4

## 2016-06-06 MED ORDER — ONDANSETRON HCL 4 MG/2ML IJ SOLN
4.0000 mg | Freq: Four times a day (QID) | INTRAMUSCULAR | Status: DC | PRN
  Administered 2016-06-06: 4 mg via INTRAVENOUS
  Filled 2016-06-06: qty 2

## 2016-06-06 MED ORDER — POTASSIUM CHLORIDE CRYS ER 20 MEQ PO TBCR
40.0000 meq | EXTENDED_RELEASE_TABLET | Freq: Once | ORAL | Status: DC
  Filled 2016-06-06: qty 2

## 2016-06-06 MED ORDER — PHENOL 1.4 % MT LIQD
1.0000 | OROMUCOSAL | Status: DC | PRN
  Administered 2016-06-06 – 2016-06-07 (×2): 1 via OROMUCOSAL
  Filled 2016-06-06: qty 177

## 2016-06-06 MED ORDER — LORAZEPAM 1 MG PO TABS
0.0000 mg | ORAL_TABLET | Freq: Four times a day (QID) | ORAL | Status: DC
Start: 2016-06-06 — End: 2016-06-06
  Administered 2016-06-06: 1 mg via ORAL
  Filled 2016-06-06: qty 1

## 2016-06-06 MED ORDER — ACETAMINOPHEN 325 MG PO TABS
650.0000 mg | ORAL_TABLET | Freq: Once | ORAL | Status: AC
  Administered 2016-06-06: 650 mg via ORAL
  Filled 2016-06-06: qty 2

## 2016-06-06 MED ORDER — FOLIC ACID 1 MG PO TABS
1.0000 mg | ORAL_TABLET | Freq: Every day | ORAL | Status: DC
  Administered 2016-06-06 – 2016-06-09 (×4): 1 mg via ORAL
  Filled 2016-06-06 (×4): qty 1

## 2016-06-06 MED ORDER — PANTOPRAZOLE SODIUM 40 MG IV SOLR
40.0000 mg | Freq: Every day | INTRAVENOUS | Status: DC
  Administered 2016-06-06 – 2016-06-07 (×2): 40 mg via INTRAVENOUS
  Filled 2016-06-06 (×2): qty 40

## 2016-06-06 MED ORDER — DEXTROSE 5 % IV SOLN
1.0000 g | Freq: Every day | INTRAVENOUS | Status: DC
  Administered 2016-06-06 – 2016-06-07 (×2): 1 g via INTRAVENOUS
  Filled 2016-06-06 (×3): qty 10

## 2016-06-06 MED ORDER — PROCHLORPERAZINE EDISYLATE 5 MG/ML IJ SOLN
10.0000 mg | Freq: Four times a day (QID) | INTRAMUSCULAR | Status: DC | PRN
  Administered 2016-06-06 – 2016-06-07 (×2): 10 mg via INTRAVENOUS
  Filled 2016-06-06 (×3): qty 2

## 2016-06-06 MED ORDER — ACETAMINOPHEN 650 MG RE SUPP: 650.0000 mg | Freq: Four times a day (QID) | RECTAL | Status: DC | PRN

## 2016-06-06 MED ORDER — LORAZEPAM 0.5 MG PO TABS
0.5000 mg | ORAL_TABLET | Freq: Four times a day (QID) | ORAL | Status: DC
  Administered 2016-06-06 (×2): 0.5 mg via ORAL
  Filled 2016-06-06 (×2): qty 1

## 2016-06-06 MED ORDER — THIAMINE HCL 100 MG/ML IJ SOLN
100.0000 mg | Freq: Every day | INTRAMUSCULAR | Status: DC
  Filled 2016-06-06: qty 2

## 2016-06-06 MED ORDER — ADULT MULTIVITAMIN W/MINERALS CH
1.0000 | ORAL_TABLET | Freq: Every day | ORAL | Status: DC
  Administered 2016-06-06 – 2016-06-09 (×4): 1 via ORAL
  Filled 2016-06-06 (×4): qty 1

## 2016-06-06 MED ORDER — LORAZEPAM 1 MG PO TABS
1.0000 mg | ORAL_TABLET | Freq: Four times a day (QID) | ORAL | Status: DC
  Administered 2016-06-07: 1 mg via ORAL
  Filled 2016-06-06 (×2): qty 1

## 2016-06-06 MED ORDER — OXYCODONE HCL 5 MG PO TABS
5.0000 mg | ORAL_TABLET | Freq: Four times a day (QID) | ORAL | Status: DC | PRN
  Administered 2016-06-06 – 2016-06-07 (×4): 5 mg via ORAL
  Filled 2016-06-06 (×5): qty 1

## 2016-06-06 MED ORDER — LORAZEPAM 1 MG PO TABS: 0.0000 mg | ORAL_TABLET | Freq: Two times a day (BID) | ORAL | Status: DC

## 2016-06-06 MED ORDER — POTASSIUM CHLORIDE 10 MEQ/100ML IV SOLN: 10.0000 meq | INTRAVENOUS | Status: DC

## 2016-06-06 MED ORDER — LORAZEPAM 2 MG/ML IJ SOLN: 1.0000 mg | Freq: Four times a day (QID) | INTRAMUSCULAR | Status: AC | PRN

## 2016-06-06 MED ORDER — VITAMIN B-1 100 MG PO TABS
100.0000 mg | ORAL_TABLET | Freq: Every day | ORAL | Status: DC
  Administered 2016-06-06 – 2016-06-09 (×4): 100 mg via ORAL
  Filled 2016-06-06 (×4): qty 1

## 2016-06-06 MED ORDER — SODIUM CHLORIDE 0.9 % IV SOLN
INTRAVENOUS | Status: DC
  Administered 2016-06-06 – 2016-06-07 (×3): via INTRAVENOUS
  Administered 2016-06-08: 1000 mL via INTRAVENOUS
  Administered 2016-06-08: 01:00:00 via INTRAVENOUS

## 2016-06-06 MED ORDER — POTASSIUM CHLORIDE 10 MEQ/100ML IV SOLN
10.0000 meq | INTRAVENOUS | Status: AC
  Administered 2016-06-06 (×2): 10 meq via INTRAVENOUS
  Filled 2016-06-06 (×2): qty 100

## 2016-06-06 MED ORDER — POTASSIUM CHLORIDE CRYS ER 20 MEQ PO TBCR
40.0000 meq | EXTENDED_RELEASE_TABLET | Freq: Once | ORAL | Status: AC
  Administered 2016-06-06: 40 meq via ORAL
  Filled 2016-06-06: qty 2

## 2016-06-06 MED ORDER — SODIUM CHLORIDE 0.9 % IV BOLUS (SEPSIS)
1000.0000 mL | Freq: Once | INTRAVENOUS | Status: AC
  Administered 2016-06-06: 1000 mL via INTRAVENOUS

## 2016-06-06 MED ORDER — ENOXAPARIN SODIUM 30 MG/0.3ML ~~LOC~~ SOLN
30.0000 mg | SUBCUTANEOUS | Status: DC
  Administered 2016-06-06: 30 mg via SUBCUTANEOUS
  Filled 2016-06-06: qty 0.3

## 2016-06-06 MED ORDER — HYDROCODONE-ACETAMINOPHEN 5-325 MG PO TABS: 1.0000 | ORAL_TABLET | Freq: Four times a day (QID) | ORAL | Status: DC | PRN

## 2016-06-06 MED ORDER — POTASSIUM CHLORIDE CRYS ER 20 MEQ PO TBCR
40.0000 meq | EXTENDED_RELEASE_TABLET | Freq: Two times a day (BID) | ORAL | Status: DC
  Administered 2016-06-06 – 2016-06-08 (×4): 40 meq via ORAL
  Filled 2016-06-06 (×6): qty 2

## 2016-06-06 MED ORDER — ACETAMINOPHEN 325 MG PO TABS
650.0000 mg | ORAL_TABLET | Freq: Four times a day (QID) | ORAL | Status: DC | PRN
  Administered 2016-06-06 – 2016-06-09 (×2): 650 mg via ORAL
  Filled 2016-06-06 (×2): qty 2

## 2016-06-06 NOTE — Progress Notes (Signed)
Pt transferred to 3M08 via bed by this RN & Jazmine, NT accompanied by Officer (present due to incarceration).  Rested with eyes closed with no signs of pain or discomfort throughout transfer; respirations even/unlabored.  Bedside report given to Mitzi, RN, & pt left in her care in stable condition at approx 1330.

## 2016-06-06 NOTE — H&P (Signed)
Family Medicine Teaching [facility] Admission History and Physical Service Pager: [phone]  Patient name: [name] Medical record number: [id number] Date of birth: April 20, 1985 Age: 31 y.o. Gender: female  Primary Care Provider: [name], MD Consultants: none Code Status: Full  Chief Complaint: vomiting  Assessment and Plan: Eileen Torres is a 31 y.o. female presenting with nausea/vomiting and chest pain. PMH is significant for IV drug abuse, EtOH abuse, anxiety, bipolar disorder, asthma, and depression.  Acute renal failure, ?UTI: 2/2 hypovolemia from vomiting d/t withdrawal and possible UTI. Patient meets 2/4 SIRS with WBC 23.2 and RR 37. qSOFA 1. DDx includes bacteremia from IVDA but is well appearing and afebrile, CXR neg, no murmur. Also possible UTI since did have urinary urgency and frequency without dysuria 2 days prior to admission. However patient states has had minimal UOP over the last 48 hours making UTI less likely. SCr on admit 2.53, baseline around 0.7.  - Admit to FPTS, attending Dr. Gwendolyn Grant - istat Lactic acid - s/p 2 L NS bolus in ED - NS @ 125cc/hr - obtain blood and urine cultures - IV ceftriaxone 1g q24h - monitor Cr - IV compazine prn for n/v - avoid nephrotoxic medications  Atypical chest pain Substernal chest pain associated with vomiting and reproducible on exam. istat troponin in ED neg and no ST changes on EKG - troponin x1 pending - am EKG - prn tylenol -start PPI in setting of vomiting and poor PO  History of polysubstance abuse - Last use on 06/04/16 with both heroin and EtOH. Has had DTs in the past. Recently seen at [facility] for detox on 4/17 of heroin, Xanax, cocaine and marijuana. Patient also with multiple overdose episodes.  - s/p clonidine 0.1mg  x1 in ED - Obtain UDS - CIWA protocol with scheduled ativan - has had scheduled morphine 3 mg q4h IV and ativan 0.5 mg q6h to prevent symptoms of opioid and benzodiazepine withdrawal -  consider starting but would need taper -hold off on CSW for detox program as paitient returning to jail  Prolonged QTc: 553 on admission EKG. - Avoid QTc prolonging drugs. - Monitor on telemetry  - Repeat EKG in a.m. - Will obtain Mg level  Hypokalemia: 2.9 on admission.  - repleted with 40 mEq Kdur in ED - monitor BMP  Mood disorder/Bipolar: Not on home medications but abuses xanax per Psychiatry note 01/2016.   H/o asthma: Not on home medications. CTAB on exam at admission  FEN/GI: regular diet Prophylaxis: lovenox  Disposition: admit to FPTS  History of Present Illness:  Eileen Torres is a 31 y.o. female presenting with nausea/vomiting and chest pain.  Brought from jail via police due to complaints of nausea/vomiting and chest pain. States last used heroin and alcohol on Saturday 06/04/16 and has been withdrawing over the last 2 days. Vomiting, unable to eat or drink. Has substernal achy chest pain when she vomits that radiates to L arm. Chills and sweats but no fever. Has not been able to urinate much over the last day but before that was having urinary urgency and frequency but no dysuria. States has been hospitalized for kidney "issues" before but that this episode feels different and more consistent with her previous drug and alcohol withdrawals. Drinks  "almost a fifth" of liquor every other day and has been in DTs in the past.   Review Of Systems: Per HPI with the following additions:   Review of Systems  Constitutional: Positive for chills and diaphoresis.  Negative for fever.  HENT: Positive for sore throat.   Respiratory: Negative for shortness of breath.   Cardiovascular: Positive for chest pain and palpitations.  Gastrointestinal: Positive for abdominal pain, blood in stool, diarrhea, nausea and vomiting. Negative for heartburn and melena.  Genitourinary: Positive for frequency. Negative for dysuria and hematuria.  Musculoskeletal: Positive for back pain.  Skin:  Negative for rash.  Neurological: Positive for weakness and headaches.  Psychiatric/Behavioral: Positive for substance abuse. Negative for hallucinations.    Patient Active Problem List   Diagnosis Date Noted  . Polysubstance (including opioids) dependence with physiological dependence (HCC)   . AKI (acute kidney injury) (HCC)   . Cocaine abuse 08/16/2015  . Pyelonephritis   . Sepsis (HCC)   . Heroin user 01/25/2015    Past Medical History: Past Medical History:  Diagnosis Date  . Anxiety   . Bipolar disorder (HCC)   . Childhood asthma   . Depression   . Encounter for Essure implantation 09/2011  . GERD (gastroesophageal reflux disease)   . Heroin abuse   . HSV (herpes simplex virus) infection 2009   ?outbreak could be HSV last pregnancy-not definate diagnosis per pt and chart- pt on Valtrex prophylacttcally  . Migraines    08/19/2015 "4-6 good ones/year"  . Polysubstance abuse   . Prolonged QT interval   . Pyelonephritis     Past Surgical History: Past Surgical History:  Procedure Laterality Date  . DILITATION & CURRETTAGE/HYSTROSCOPY WITH ESSURE  2013    Social History: Social History  Substance Use Topics  . Smoking status: Current Every Day Smoker    Packs/day: 0.25    Years: 19.00    Types: Cigarettes  . Smokeless tobacco: Never Used  . Alcohol use No   Additional social history: nearly liquor every other day. Last drink 06/04/16.  Heroin use.  Please also refer to relevant sections of EMR.  Family History: Family History  Problem Relation Age of Onset  . Cancer Other   . Diabetes Other   . Heart failure Other   . Stroke Other     Allergies and Medications: No Known Allergies No current facility-administered medications on file prior to encounter.    Current Outpatient Prescriptions on File Prior to Encounter  Medication Sig Dispense Refill  . levofloxacin (LEVAQUIN) 250 MG tablet Take 1 tablet (250 mg total) by mouth daily. 10 tablet 0  .  prochlorperazine (COMPAZINE) 5 MG tablet Take 1 tablet (5 mg total) by mouth every 6 (six) hours as needed for nausea or vomiting. 15 tablet 0    Objective: BP 97/73   Pulse 77   Temp 97.8 F (36.6 C) (Oral)   Resp (!) 37   Ht 5\' 3"  (1.6 m)   Wt 49.9 kg (110 lb)   SpO2 98%   BMI 19.49 kg/m  Exam: General: lying in bed, in NAD, dishelved appearing Eyes: EOMI, conjunctiva normal. Dark under eye circles ENTM: Dry mucous membranes, normal nose. Neck: supple, normal ROM Cardiovascular: RRR, no murmurs Respiratory: CTAB, normal effort on room air Gastrointestinal: mildly TTP diffusely, soft, nondistended, + bowel sounds. No CVA tenderness MSK: moving all limbs equally Derm: warm and dry Neuro: no focal deficits Psych: mood and affect appropriate  Labs and Imaging: CBC BMET   Recent Labs Lab 06/06/16 0051  WBC 23.2*  HGB 16.8*  HCT 48.2*  PLT 411*    Recent Labs Lab 06/06/16 0051  NA 136  K 2.9*  CL 88*  CO2 28  BUN 61*  CREATININE 2.53*  GLUCOSE 129*  CALCIUM 9.4     Urinalysis    Component Value Date/Time   COLORURINE AMBER (A) 06/06/2016 0207   APPEARANCEUR CLOUDY (A) 06/06/2016 0207   LABSPEC 1.021 06/06/2016 0207   PHURINE 5.0 06/06/2016 0207   GLUCOSEU NEGATIVE 06/06/2016 0207   HGBUR SMALL (A) 06/06/2016 0207   BILIRUBINUR NEGATIVE 06/06/2016 0207   BILIRUBINUR SMALL 08/28/2015 1420   KETONESUR NEGATIVE 06/06/2016 0207   PROTEINUR 100 (A) 06/06/2016 0207   UROBILINOGEN 1.0 08/28/2015 1420   UROBILINOGEN 0.2 10/08/2013 1912   NITRITE NEGATIVE 06/06/2016 0207   LEUKOCYTESUR TRACE (A) 06/06/2016 0207   Troponin POC: 0.00  Upreg neg   Dg Chest Portable 1 View  Result Date: 06/06/2016 CLINICAL DATA:  Mid chest pain this evening EXAM: PORTABLE CHEST 1 VIEW COMPARISON:  02/09/2016 FINDINGS: The heart size and mediastinal contours are within normal limits. Both lungs are clear. The visualized skeletal structures are unremarkable. IMPRESSION: No  active disease. Electronically Signed   By: Tollie Ethavid  Kwon M.D.   On: 06/06/2016 00:42    Leland HerYoo, Elsia J, DO 06/06/2016, 1:48 AM PGY-1, [facility] FPTS Intern pager: [phone], text pages welcome  FPTS Upper-Level Resident Addendum  I have independently interviewed and examined the patient. I have discussed the above with the original author and agree with their documentation. My edits for correction/addition/clarification are in pink. Please see also any attending notes.   Pincus LargePhelps, Kaidon Kinker Y, DO PGY-3, [facility] FPTS Service pager: [phone] (text pages welcome through [facility])

## 2016-06-06 NOTE — Progress Notes (Signed)
FPTS Interim Progress Note  S: No acute events overnight. This morning states her headache is improved after tylenol but having sore throat. States morphine works well for her in the past.  O: BP (!) 114/57   Pulse (!) 58   Temp 98.8 F (37.1 C)   Resp 16   Ht 5\' 3"  (1.6 m)   Wt 48.4 kg (106 lb 11.2 oz)   SpO2 100%   BMI 18.90 kg/m   General: lying in bed, in NAD, appears drowsy HEENT: PERRL, oropharynx unremarkable. Neck: supple, normal ROM Cardiovascular: RRR, no murmurs Respiratory: CTAB, normal effort on room air Gastrointestinal: mildly TTP diffusely, soft, nondistended, + bowel sounds.   A/P: Acute renal failure with ? UTI and urinary retention. Overnight bladder scan showed 247cc, no need for cath. Still no UOP this morning after aggressive hydration. Cr improved to 1.57 from 2.53 this morning with IV hydration.  - continue MIVF - monitor Cr and I/Os - Follow up urine culture, continue IV Ctx pending results - repeat bladder scan this morning, I&O cath for volume >300cc  History of polysubstance abuse. CIWA score 5. UDS positive for cocaine - Move to SDU for closer monitoring - Scheduled ativan 0.5mg  q6h - CIWA protocol  Chest pain, seems atypical in nature given reproducibility. Morning EKG showing flipped T waves and prolonged QTc 603. Troponins neg - repeat EKG no change, low suspicion for ACS - monitor electrolytes  Nausea/vomiting with sore throat likely 2/2 retching.  - IV compazine prn - chloraseptic spray  Hypokalemia. 2/2 GI losses. K 2.8 today with Mag 2.1 - replete and monitor  Leland HerYoo, Orman Matsumura J, DO 06/06/2016, 9:36 AM PGY-1, [facility] Service pager [phone]

## 2016-06-06 NOTE — Care Management Note (Addendum)
Case Management Note  Patient Details  Name: Eileen Torres MRN: [id number] Date of Birth: 02/15/1985  Subjective/Objective:    From jail,  known polysubstance abuse including IV drug use, alcohol abuse, a polar disorder, anxiety presents with 2 day history of nausea vomiting and chest pain. Patient recently incarcerated. States she last used heroin and alcohol on Saturday. She is also having decreased urination for the past 1-2 days, AKI. Plan will be to return to jail at discharge.     PCP Howard PouchLauren Feng            Action/Plan: NCM will follow for dc needs.   Expected Discharge Date:                  Expected Discharge Plan:  [facility]rections Facility  In-House Referral:     Discharge planning Services  CM Consult  Post Acute Care Choice:    Choice offered to:     DME Arranged:    DME Agency:     HH Arranged:    HH Agency:     Status of Service:  In process, will continue to follow  If discussed at Long Length of Stay Meetings, dates discussed:    Additional Comments:  Leone Havenaylor, Hassel Uphoff Clinton, RN 06/06/2016, 3:58 PM

## 2016-06-06 NOTE — Progress Notes (Signed)
FPTS Social Note Attempted to call patient's room socially to see how she is doing while she is in the hospital. No answer x2. Will continue to follow along with the excellent care of the FPTS team.  Howard PouchLauren Milliana Reddoch, MD PGY-1 [facility]

## 2016-06-06 NOTE — Progress Notes (Signed)
Call to Dr with patient not able to tolerate Potassium IV and patient starting having signs of withdrawal felling cold and clammy.  Dr Stated he would write orders.

## 2016-06-06 NOTE — ED Notes (Signed)
Attempted to call report to floor. Receiving nurse had concerns about patients co

## 2016-06-06 NOTE — Progress Notes (Signed)
Dr.Yoo texted with bladder scan results and EKG results.

## 2016-06-06 NOTE — Progress Notes (Signed)
Admitted to [phone] via stretcher. [facility]. Patient handcuffed to stretcher then bed. Assessment as noted.

## 2016-06-06 NOTE — ED Notes (Signed)
Attempted to call report

## 2016-06-06 NOTE — ED Notes (Signed)
ED Provider at bedside. 

## 2016-06-07 LAB — BLOOD CULTURE ID PANEL (REFLEXED)
Acinetobacter baumannii: NOT DETECTED
CANDIDA PARAPSILOSIS: NOT DETECTED
CANDIDA TROPICALIS: NOT DETECTED
Candida albicans: NOT DETECTED
Candida glabrata: NOT DETECTED
Candida krusei: NOT DETECTED
ENTEROBACTERIACEAE SPECIES: NOT DETECTED
Enterobacter cloacae complex: NOT DETECTED
Enterococcus species: NOT DETECTED
Escherichia coli: NOT DETECTED
HAEMOPHILUS INFLUENZAE: NOT DETECTED
KLEBSIELLA OXYTOCA: NOT DETECTED
KLEBSIELLA PNEUMONIAE: NOT DETECTED
Listeria monocytogenes: NOT DETECTED
METHICILLIN RESISTANCE: NOT DETECTED
NEISSERIA MENINGITIDIS: NOT DETECTED
PROTEUS SPECIES: NOT DETECTED
Pseudomonas aeruginosa: NOT DETECTED
SERRATIA MARCESCENS: NOT DETECTED
STAPHYLOCOCCUS AUREUS BCID: NOT DETECTED
STAPHYLOCOCCUS SPECIES: DETECTED — AB
STREPTOCOCCUS SPECIES: NOT DETECTED
Streptococcus agalactiae: NOT DETECTED
Streptococcus pneumoniae: NOT DETECTED
Streptococcus pyogenes: NOT DETECTED

## 2016-06-07 LAB — BASIC METABOLIC PANEL
ANION GAP: 11 (ref 5–15)
BUN: 21 mg/dL — AB (ref 6–20)
CO2: 24 mmol/L (ref 22–32)
Calcium: 8.9 mg/dL (ref 8.9–10.3)
Chloride: 104 mmol/L (ref 101–111)
Creatinine, Ser: 0.83 mg/dL (ref 0.44–1.00)
GFR calc Af Amer: >60 mL/min (ref >60)
GLUCOSE: 98 mg/dL (ref 65–99)
Potassium: 3.7 mmol/L (ref 3.5–5.1)
Sodium: 139 mmol/L (ref 135–145)

## 2016-06-07 LAB — URINE CULTURE: CULTURE: NO GROWTH

## 2016-06-07 LAB — CBC
HCT: 38.1 % (ref 36.0–46.0)
Hemoglobin: 12.2 g/dL (ref 12.0–15.0)
MCH: 26.3 pg (ref 26.0–34.0)
MCHC: 32 g/dL (ref 30.0–36.0)
MCV: 82.3 fL (ref 78.0–100.0)
PLATELETS: 299 10*3/uL (ref 150–400)
RBC: 4.63 MIL/uL (ref 3.87–5.11)
RDW: 14.3 % (ref 11.5–15.5)
WBC: 12 10*3/uL — AB (ref 4.0–10.5)

## 2016-06-07 MED ORDER — NICOTINE 21 MG/24HR TD PT24
21.0000 mg | MEDICATED_PATCH | TRANSDERMAL | Status: DC
  Administered 2016-06-07 – 2016-06-08 (×2): 21 mg via TRANSDERMAL
  Filled 2016-06-07 (×2): qty 1

## 2016-06-07 MED ORDER — FAMOTIDINE 20 MG PO TABS
20.0000 mg | ORAL_TABLET | Freq: Two times a day (BID) | ORAL | Status: DC
  Administered 2016-06-07 – 2016-06-09 (×5): 20 mg via ORAL
  Filled 2016-06-07 (×5): qty 1

## 2016-06-07 MED ORDER — PANTOPRAZOLE SODIUM 40 MG PO TBEC
40.0000 mg | DELAYED_RELEASE_TABLET | Freq: Every day | ORAL | Status: DC
  Administered 2016-06-08 – 2016-06-09 (×2): 40 mg via ORAL
  Filled 2016-06-07 (×2): qty 1

## 2016-06-07 MED ORDER — LORAZEPAM 0.5 MG PO TABS
0.5000 mg | ORAL_TABLET | Freq: Four times a day (QID) | ORAL | Status: DC
  Administered 2016-06-07 – 2016-06-08 (×4): 0.5 mg via ORAL
  Filled 2016-06-07 (×4): qty 1

## 2016-06-07 MED ORDER — ONDANSETRON 4 MG PO TBDP
4.0000 mg | ORAL_TABLET | Freq: Three times a day (TID) | ORAL | Status: DC | PRN
  Administered 2016-06-07 – 2016-06-09 (×4): 4 mg via ORAL
  Filled 2016-06-07 (×4): qty 1

## 2016-06-07 MED ORDER — PROCHLORPERAZINE EDISYLATE 5 MG/ML IJ SOLN
10.0000 mg | Freq: Four times a day (QID) | INTRAMUSCULAR | Status: DC | PRN
  Administered 2016-06-08 (×3): 10 mg via INTRAVENOUS
  Filled 2016-06-07 (×4): qty 2

## 2016-06-07 MED ORDER — OXYCODONE HCL 5 MG PO TABS
5.0000 mg | ORAL_TABLET | Freq: Three times a day (TID) | ORAL | Status: DC | PRN
  Administered 2016-06-07 – 2016-06-08 (×3): 5 mg via ORAL
  Filled 2016-06-07 (×3): qty 1

## 2016-06-07 NOTE — Progress Notes (Addendum)
Family Medicine Teaching Service Daily Progress Note Intern Pager: [phone]  Patient name: Eileen Torres Medical record number: [id number] Date of birth: 04/24/1985 Age: 31 y.o. Gender: female  Primary Care Provider: Howard PouchFeng, Lauren, MD Consultants: none Code Status: Full  Pt Overview and Major Events to Date:  6/4 admitted for AKI  Assessment and Plan: Eileen Torres is a 31 y.o. female presenting with nausea/vomiting and chest pain. PMH is significant for IV drug abuse, EtOH abuse, anxiety, bipolar disorder, asthma, and depression.  AKI, improving. Cr improved back to baseline 0.83 with IV hydration. UOP 1.45L over last 24h. Urine culture no growth. Blood culture 1 of 2 growing coag neg staph, likely contaminate - Discontinue MIVF since taking good po - monitor Cr and I/Os  - Discontinue ABX and monitor  History of polysubstance abuse. CIWA score elevated to 14 overnight, required increased ativan 1mg  q6h with improvement of CIWA scores to 3-0.  - Decrease scheduled ativan back to 0.5mg  q6h - Space out Oxy IR 5mg  from q6prn to q8 prn, for symptoms of withdrawal not for pain. - CIWA protocol  Nausea/vomiting with sore throat likely 2/2 retching. Possible GERD component - zofran q8prn since prolonged QTc is resolved - chloraseptic spray prn - on protonix at home - add pepcid  Mood disorder/Bipolar: Not on home medications but abuses xanax per Psychiatry note 01/2016.   H/o asthma: Not on home medications. CTAB on exam at admission  Atypical chest pain, resolved. Troponins neg, EKG this am with resolved of flipped T waves, low suspicion for ACS - monitor electrolytes  Prolonged QTc, resolved: 473 on morning EKG.  FEN/GI: regular diet PPx: lovenox  Disposition: pending medical improvement  Subjective:  This morning feels improved. Biggest concern is sore throat and nausea this morning that is unrelieved by chloraseptic spray and compazine.  Objective: Temp:   [98.5 F (36.9 C)-99.5 F (37.5 C)] 98.9 F (37.2 C) (06/05 0714) Pulse Rate:  [57-85] 57 (06/05 0714) Resp:  [17-32] 32 (06/05 0714) BP: (115-124)/(69-81) 124/81 (06/05 0714) SpO2:  [97 %-100 %] 97 % (06/05 0714) Physical Exam: General: lying in bed comfortably, in NAD HEENT: MMM, oropharynx nonerythematous Cardiovascular: RRR, no murmurs Respiratory: CTAB, normal effort on room air Abdomen: soft, mildly TTP over mid epigastric area, nondistended, + bowel sounds Extremities: no LE edema  Laboratory:  Recent Labs Lab 06/06/16 0051 06/06/16 0659 06/07/16 0348  WBC 23.2* 17.7* 12.0*  HGB 16.8* 13.1 12.2  HCT 48.2* 38.8 38.1  PLT 411* 338 299    Recent Labs Lab 06/06/16 0051 06/06/16 0659 06/07/16 0348  NA 136 138 139  K 2.9* 2.8* 3.7  CL 88* 98* 104  CO2 28 28 24   BUN 61* 54* 21*  CREATININE 2.53* 1.57* 0.83  CALCIUM 9.4 7.8* 8.9  PROT  --  7.5  --   BILITOT  --  0.4  --   ALKPHOS  --  100  --   ALT  --  21  --   AST  --  17  --   GLUCOSE 129* 154* 98   EKG sinus bradycardia, QTc 473  Blood culture 1 of 2 growing coag neg staph  Imaging/Diagnostic Tests: No results found.  Leland HerYoo, Elsia J, DO 06/07/2016, 7:21 AM PGY-1, [facility]  FPTS Intern pager: [phone], text pages welcome  I saw and examined this patient.  Discussed in rounds.  Agree with the documentation and management of Dr. Artist PaisYoo.

## 2016-06-07 NOTE — Progress Notes (Signed)
PHARMACY - PHYSICIAN COMMUNICATION CRITICAL VALUE ALERT - BLOOD CULTURE IDENTIFICATION (BCID)  Results for orders placed or performed during the hospital encounter of 06/05/16  Blood Culture ID Panel (Reflexed) (Collected: 06/06/2016  4:57 AM)  Result Value Ref Range   Enterococcus species NOT DETECTED NOT DETECTED   Listeria monocytogenes NOT DETECTED NOT DETECTED   Staphylococcus species DETECTED (A) NOT DETECTED   Staphylococcus aureus NOT DETECTED NOT DETECTED   Methicillin resistance NOT DETECTED NOT DETECTED   Streptococcus species NOT DETECTED NOT DETECTED   Streptococcus agalactiae NOT DETECTED NOT DETECTED   Streptococcus pneumoniae NOT DETECTED NOT DETECTED   Streptococcus pyogenes NOT DETECTED NOT DETECTED   Acinetobacter baumannii NOT DETECTED NOT DETECTED   Enterobacteriaceae species NOT DETECTED NOT DETECTED   Enterobacter cloacae complex NOT DETECTED NOT DETECTED   Escherichia coli NOT DETECTED NOT DETECTED   Klebsiella oxytoca NOT DETECTED NOT DETECTED   Klebsiella pneumoniae NOT DETECTED NOT DETECTED   Proteus species NOT DETECTED NOT DETECTED   Serratia marcescens NOT DETECTED NOT DETECTED   Haemophilus influenzae NOT DETECTED NOT DETECTED   Neisseria meningitidis NOT DETECTED NOT DETECTED   Pseudomonas aeruginosa NOT DETECTED NOT DETECTED   Candida albicans NOT DETECTED NOT DETECTED   Candida glabrata NOT DETECTED NOT DETECTED   Candida krusei NOT DETECTED NOT DETECTED   Candida parapsilosis NOT DETECTED NOT DETECTED   Candida tropicalis NOT DETECTED NOT DETECTED    Name of physician (or Provider) Contacted: Dr. Artist PaisYoo  Changes to prescribed antibiotics required: None for now. Could be contaminant since only in 1/2 cultures.  Eileen Torres, PharmD, BCPS Clinical pharmacist, pager [phone] 06/07/2016  3:29 AM

## 2016-06-08 LAB — CBC
HCT: 37 % (ref 36.0–46.0)
Hemoglobin: 11.8 g/dL — ABNORMAL LOW (ref 12.0–15.0)
MCH: 26.2 pg (ref 26.0–34.0)
MCHC: 31.9 g/dL (ref 30.0–36.0)
MCV: 82.2 fL (ref 78.0–100.0)
Platelets: 251 10*3/uL (ref 150–400)
RBC: 4.5 MIL/uL (ref 3.87–5.11)
RDW: 13.7 % (ref 11.5–15.5)
WBC: 7 10*3/uL (ref 4.0–10.5)

## 2016-06-08 LAB — BASIC METABOLIC PANEL
ANION GAP: 10 (ref 5–15)
BUN: 11 mg/dL (ref 6–20)
CO2: 22 mmol/L (ref 22–32)
CREATININE: 0.74 mg/dL (ref 0.44–1.00)
Calcium: 8.8 mg/dL — ABNORMAL LOW (ref 8.9–10.3)
Chloride: 105 mmol/L (ref 101–111)
GLUCOSE: 98 mg/dL (ref 65–99)
Potassium: 3.9 mmol/L (ref 3.5–5.1)
Sodium: 137 mmol/L (ref 135–145)

## 2016-06-08 LAB — CULTURE, BLOOD (ROUTINE X 2): Special Requests: ADEQUATE

## 2016-06-08 MED ORDER — LOPERAMIDE HCL 2 MG PO CAPS: 2.0000 mg | ORAL_CAPSULE | ORAL | Status: DC | PRN

## 2016-06-08 MED ORDER — OXYCODONE HCL 5 MG PO TABS
2.5000 mg | ORAL_TABLET | Freq: Three times a day (TID) | ORAL | Status: DC | PRN
  Administered 2016-06-08 – 2016-06-09 (×2): 2.5 mg via ORAL
  Filled 2016-06-08 (×2): qty 1

## 2016-06-08 MED ORDER — LOPERAMIDE HCL 2 MG PO CAPS
4.0000 mg | ORAL_CAPSULE | Freq: Once | ORAL | Status: AC
  Administered 2016-06-08: 4 mg via ORAL
  Filled 2016-06-08: qty 2

## 2016-06-08 MED ORDER — SODIUM CHLORIDE 0.9 % IV SOLN
INTRAVENOUS | Status: DC
  Administered 2016-06-08: 22:00:00 via INTRAVENOUS

## 2016-06-08 NOTE — Progress Notes (Signed)
NURSING PROGRESS NOTE  Eileen Torres [id number] Transfer Data: 06/08/2016 2:48 PM Attending Provider: Tobey GrimWalden, Jeffrey H, MD WUJ:[id number]:Feng, Leotis ShamesLauren, MD Code Status: FULL   Eileen Torres is a 31 y.o. female patient [facility] [address]  -No acute distress noted.  -No complaints of shortness of breath.  -No complaints of chest pain.   Last Documented Vital Signs: Blood pressure 104/82, pulse (!) 57, temperature 98.4 F (36.9 C), temperature source Oral, resp. rate (!) 33, height 5\' 3"  (1.6 m), weight 49.6 kg (109 lb 5.6 oz), SpO2 100 %.  IV Fluids:  IV in place, occlusive dsg intact without redness, IV cath hand left, condition patent and no redness normal saline.   Allergies:  Patient has no known allergies.  Past Medical History:   has a past medical history of Anxiety; Bipolar disorder (HCC); Childhood asthma; Depression; Encounter for Essure implantation (09/2011); GERD (gastroesophageal reflux disease); Heroin abuse; HSV (herpes simplex virus) infection (2009); Migraines; Polysubstance abuse; Prolonged QT interval; and Pyelonephritis.  Past Surgical History:   has a past surgical history that includes Dilatation & currettage/hysteroscopy with essure (2013).  Social History:   reports that she has been smoking Cigarettes.  She has a 4.75 pack-year smoking history. She has never used smokeless tobacco. She reports that she uses drugs. She reports that she does not drink alcohol.  Skin: intact except where otherwise charted.  Patient/Family orientated to room. Information packet given to patient/family. Admission inpatient armband information verified with patient/family to include name and date of birth and placed on patient arm. Side rails up x 2, fall assessment and education completed with patient/family. Patient/family able to verbalize understanding of risk associated with falls and verbalized understanding to call for assistance before getting out of bed. Call light  within reach. Patient/family able to voice and demonstrate understanding of unit orientation instructions.

## 2016-06-08 NOTE — Progress Notes (Signed)
Family Medicine Teaching Service Daily Progress Note Intern Pager: [phone]  Patient name: Eileen Torres Medical record number: [id number] Date of birth: 11/12/1985 Age: 31 y.o. Gender: female  Primary Care Provider: Howard PouchFeng, Lauren, MD Consultants: none Code Status: Full  Pt Overview and Major Events to Date:  6/4 admitted for AKI  Assessment and Plan: Eileen Torres is a 31 y.o. female presenting with nausea/vomiting and chest pain. PMH is significant for IV drug abuse, EtOH abuse, anxiety, bipolar disorder, asthma, and depression.  Polysubstance abuse. CIWA score elevated to 18 overnight, required 3 doses prn ativan 1mg  and 2 doses of oxycodone 5mg  over the last 24h. She is clinically improving - Transfer out of SDU - Monitor her off of scheduled ativan - Oxy IR decreased from 5mg  to 2.5mg  q8 prn, for symptoms of withdrawal not for pain. - CIWA protocol  Nausea/vomiting with sore throat likely 2/2 retching. Possible GERD component - compazine and zofran q8prn  - chloraseptic spray prn - continue protonix and pepcid   Mood disorder/Bipolar: Not on home medications but abuses xanax per Psychiatry note 01/2016.   H/o asthma: Not on home medications. CTAB on exam at admission  AKI, resolved. Cr improved back to baseline.   Atypical chest pain, resolved.  Prolonged QTc, resolved  FEN/GI: regular diet PPx: lovenox  Disposition: pending medical improvement from EtOH withdrawal, likely monitoring for additional 24h before discharging  Subjective:  States last night had an episode of diaphoresis from her withdrawal. Feels improved this morning. No chest pain. Some urinary discomfort since she started urinating again. No fevers. Sore throat and nausea improved.  Objective: Temp:  [97.8 F (36.6 C)-99 F (37.2 C)] 98.4 F (36.9 C) (06/06 0824) Pulse Rate:  [50-84] 57 (06/06 0500) Resp:  [23-40] 33 (06/06 0500) BP: (104-136)/(76-84) 104/82 (06/06 0824) SpO2:  [99  %-100 %] 100 % (06/06 0500) Weight:  [49.6 kg (109 lb 5.6 oz)] 49.6 kg (109 lb 5.6 oz) (06/06 0745) Physical Exam: General: lying in bed comfortably, in NAD HEENT: MMM, oropharynx nonerythematous Cardiovascular: RRR, no murmurs Respiratory: CTAB, normal effort on room air Abdomen: soft, mildly TTP over mid epigastric area, nondistended, + bowel sounds Extremities: no LE edema  Laboratory:  Recent Labs Lab 06/06/16 0659 06/07/16 0348 06/08/16 0607  WBC 17.7* 12.0* 7.0  HGB 13.1 12.2 11.8*  HCT 38.8 38.1 37.0  PLT 338 299 251    Recent Labs Lab 06/06/16 0659 06/07/16 0348 06/08/16 0607  NA 138 139 137  K 2.8* 3.7 3.9  CL 98* 104 105  CO2 28 24 22   BUN 54* 21* 11  CREATININE 1.57* 0.83 0.74  CALCIUM 7.8* 8.9 8.8*  PROT 7.5  --   --   BILITOT 0.4  --   --   ALKPHOS 100  --   --   ALT 21  --   --   AST 17  --   --   GLUCOSE 154* 98 98   EKG sinus bradycardia, QTc 473  Blood culture 1 of 2 growing coag neg staph, other NG 1 day  Urine culture no growth  Imaging/Diagnostic Tests: No results found.  Leland HerYoo, Deshanta Lady J, DO 06/08/2016, 9:09 AM PGY-1, [facility]  FPTS Intern pager: [phone], text pages welcome

## 2016-06-08 NOTE — Progress Notes (Addendum)
Left message for hand off report to RN  Was contacted by Maralyn SagoSarah and provided hand off report. [facility] was [facility] without incident[facility] items provided by unit.

## 2016-06-09 ENCOUNTER — Encounter (HOSPITAL_COMMUNITY): Payer: Self-pay | Admitting: General Practice

## 2016-06-09 LAB — BASIC METABOLIC PANEL
ANION GAP: 8 (ref 5–15)
BUN: 11 mg/dL (ref 6–20)
CALCIUM: 8.8 mg/dL — AB (ref 8.9–10.3)
CO2: 22 mmol/L (ref 22–32)
Chloride: 106 mmol/L (ref 101–111)
Creatinine, Ser: 0.77 mg/dL (ref 0.44–1.00)
GFR calc Af Amer: >60 mL/min (ref >60)
GFR calc non Af Amer: >60 mL/min (ref >60)
GLUCOSE: 107 mg/dL — AB (ref 65–99)
POTASSIUM: 4 mmol/L (ref 3.5–5.1)
Sodium: 136 mmol/L (ref 135–145)

## 2016-06-09 MED ORDER — PROCHLORPERAZINE MALEATE 5 MG PO TABS: 5.0000 mg | ORAL_TABLET | Freq: Four times a day (QID) | ORAL | Status: DC | PRN

## 2016-06-09 MED ORDER — PROCHLORPERAZINE MALEATE 5 MG PO TABS: 5.0000 mg | ORAL_TABLET | Freq: Four times a day (QID) | ORAL | 0 refills | Status: DC | PRN

## 2016-06-09 MED ORDER — PANTOPRAZOLE SODIUM 40 MG PO TBEC: 40.0000 mg | DELAYED_RELEASE_TABLET | Freq: Every day | ORAL | 0 refills | Status: DC

## 2016-06-09 MED ORDER — VITAMIN B-1 100 MG PO TABS: 100.0000 mg | ORAL_TABLET | ORAL | 0 refills | Status: DC

## 2016-06-09 NOTE — Discharge Summary (Signed)
[facility] Discharge Summary  Patient name: Eileen Torres Medical record number: [id number] Date of birth: 1985-03-31 Age: 31 y.o. Gender: female Date of Admission: 06/05/2016  Date of Discharge: 06/09/16 Admitting Physician: Alveda Reasons, MD  Primary Care Provider: Everrett Coombe, MD Consultants: none  Indication for Hospitalization: Acute kidney injury  Discharge Diagnoses/Problem List:  Polysubstance use with ETOH & heroin withdrawal AKI GERD Mood disorder/Bipolar H/o asthma  Disposition: Corrections facility  Discharge Condition: Stable, improved  Discharge Exam:  General: lying in bed comfortably, in NAD HEENT: MMM, oropharynx nonerythematous Cardiovascular: RRR, no murmurs Respiratory: CTAB, normal effort on room air Abdomen: soft, mildly TTP over mid epigastric area, nondistended, + bowel sounds Extremities: no LE edema  Brief Hospital Course:  Eileen Torres a 31 y.o.femalePMH significant for IV drug abuse, EtOH abuse, anxiety, bipolar disorder, asthma, and depression who presented with nausea/vomiting and chest pain.   Patient presented with 2 day history of n/v and atypical chest pain after recently being incarcerated, with last alcohol and heroin use 2 days prior to admission. Workup for chest pain was negative for cardiac cause and thought to be secondary to vomiting profusely and had minimal UOP for the last 24-48h. She met 2/4 SIRS criteria likely from withdrawal symptoms but had history of urinary urgency/frequency without dysuria prior to her incarceration 2 days before so was started on IV Ctx until her urine culture resulted as negative. She was discontinued off ABX and did well through her hospital stay without urinary symptoms of fever. Given patient's previous history of DTs, she was started on scheduled ativan and monitored under CIWA protocol. For her heroin withdrawal she was given oxycodone prn for symptoms of  withdrawal, not for pain. She did well on both and was able to be tapered off by day of discharge. Patient voiced that she was hopeful about getting clean off of both alcohol and recreational drugs.  Issues for Follow Up:  1. Encourage efforts to abstain from alcohol and recreational drugs.  Significant Procedures: none  Significant Labs and Imaging:   Recent Labs Lab 06/06/16 0659 06/07/16 0348 06/08/16 0607  WBC 17.7* 12.0* 7.0  HGB 13.1 12.2 11.8*  HCT 38.8 38.1 37.0  PLT 338 299 251    Recent Labs Lab 06/06/16 0051 06/06/16 0659 06/07/16 0348 06/08/16 0607 06/09/16 0534  NA 136 138 139 137 136  K 2.9* 2.8* 3.7 3.9 4.0  CL 88* 98* 104 105 106  CO2 _0 GLUCOSE 129* 154* 98 98 107*  BUN 61* 54* 21* 11 11  CREATININE 2.53* 1.57* 0.83 0.74 0.77  CALCIUM 9.4 7.8* 8.9 8.8* 8.8*  MG  --  2.1  --   --   --   ALKPHOS  --  100  --   --   --   AST  --  17  --   --   --   ALT  --  21  --   --   --   ALBUMIN  --  3.8  --   --   --      Results/Tests Pending at Time of Discharge: none  Discharge Medications:  Allergies as of 06/09/2016   No Known Allergies     Medication List    STOP taking these medications   chlordiazePOXIDE 25 MG capsule Commonly known as:  LIBRIUM   levofloxacin 250 MG tablet Commonly known as:  LEVAQUIN   loperamide 2 MG tablet Commonly  known as:  IMODIUM A-D     TAKE these medications   acetaminophen 325 MG tablet Commonly known as:  TYLENOL Take 650 mg by mouth 3 (three) times daily as needed for moderate pain.   pantoprazole 40 MG tablet Commonly known as:  PROTONIX Take 1 tablet (40 mg total) by mouth daily. Start taking on:  06/10/2016   prochlorperazine 5 MG tablet Commonly known as:  COMPAZINE Take 1 tablet (5 mg total) by mouth every 6 (six) hours as needed for nausea or vomiting.   thiamine 100 MG tablet Commonly known as:  VITAMIN B-1 Take 1 tablet (100 mg total) by mouth every morning.       Discharge  Instructions: Please refer to Patient Instructions section of EMR for full details.  Patient was counseled important signs and symptoms that should prompt return to medical care, changes in medications, dietary instructions, activity restrictions, and follow up appointments.   Follow-Up Appointments: Patient was discharged to a corrections facility.  Bufford Lope, DO 06/09/2016, 1:46 PM PGY-1, [place]

## 2016-06-09 NOTE — Discharge Instructions (Signed)
It has been a pleasure taking care of you! You were admitted due to dehydration and for withdrawal. We have treated you with IV fluids and helped you detox while you were here. We are discharging you on oral compazine that you can take as needed for nausea while you finish detoxing.

## 2016-06-09 NOTE — Progress Notes (Signed)
Family Medicine Teaching Service Daily Progress Note Intern Pager: [phone]  Patient name: Eileen Torres Medical record number: [id number] Date of birth: 03/02/1985 Age: 31 y.o. Gender: female  Primary Care Provider: Howard PouchFeng, Lauren, MD Consultants: none Code Status: Full  Pt Overview and Major Events to Date:  6/4 admitted for AKI  Assessment and Plan: Eileen Torres is a 31 y.o. female presenting with nausea/vomiting and chest pain. PMH is significant for IV drug abuse, EtOH abuse, anxiety, bipolar disorder, asthma, and depression.  Polysubstance abuse. CIWA scores 4-0 overnight, required 2 doses prn ativan 1mg  and 1 dose of oxycodone 5mg  over the last 24h. Doing well off of scheduled ativan. - Oxy IR 2.5mg  q8 prn, for symptoms of withdrawal not for pain. - CIWA protocol  Nausea/vomiting with sore throat likely 2/2 retching. Possible GERD component - compazine q6prn  - chloraseptic spray prn - continue protonix and pepcid   Mood disorder/Bipolar: Not on home medications but abuses xanax per Psychiatry note 01/2016.   H/o asthma: Not on home medications. CTAB on exam at admission  AKI, resolved. Cr improved back to baseline.   Atypical chest pain, resolved.  Prolonged QTc, resolved  FEN/GI: regular diet PPx: lovenox  Disposition: discharge today  Subjective:  No overnight events. Had 2 episodes of nausea overnight but overall improved. No acute concerns today. Amenable to discharge today and feeling hopeful about getting clean.   Objective: Temp:  [98.1 F (36.7 C)-99.3 F (37.4 C)] 98.1 F (36.7 C) (06/07 0452) Pulse Rate:  [63-75] 63 (06/07 0452) Resp:  [16-18] 18 (06/07 0452) BP: (95-115)/(64-82) 115/71 (06/07 0452) SpO2:  [98 %-100 %] 100 % (06/07 0452) Weight:  [49.6 kg (109 lb 5.6 oz)] 49.6 kg (109 lb 5.6 oz) (06/06 0745) Physical Exam: General: lying in bed comfortably, in NAD HEENT: MMM, oropharynx nonerythematous Cardiovascular: RRR, no  murmurs Respiratory: CTAB, normal effort on room air Abdomen: soft, mildly TTP over mid epigastric area, nondistended, + bowel sounds Extremities: no LE edema  Laboratory:  Recent Labs Lab 06/06/16 0659 06/07/16 0348 06/08/16 0607  WBC 17.7* 12.0* 7.0  HGB 13.1 12.2 11.8*  HCT 38.8 38.1 37.0  PLT 338 299 251    Recent Labs Lab 06/06/16 0659 06/07/16 0348 06/08/16 0607 06/09/16 0534  NA 138 139 137 136  K 2.8* 3.7 3.9 4.0  CL 98* 104 105 106  CO2 28 24 22 22   BUN 54* 21* 11 11  CREATININE 1.57* 0.83 0.74 0.77  CALCIUM 7.8* 8.9 8.8* 8.8*  PROT 7.5  --   --   --   BILITOT 0.4  --   --   --   ALKPHOS 100  --   --   --   ALT 21  --   --   --   AST 17  --   --   --   GLUCOSE 154* 98 98 107*   EKG sinus bradycardia, QTc 473  Blood culture 1 of 2 growing coag neg staph, other NG 1 day  Urine culture no growth  Imaging/Diagnostic Tests: No results found.  Leland HerYoo, Berino Cohick J, DO 06/09/2016, 7:18 AM PGY-1, [facility]  FPTS Intern pager: [phone], text pages welcome

## 2016-06-09 NOTE — Progress Notes (Signed)
Pt given discharge instructions, prescriptions, and care notes. Pt verbalized understanding AEB no further questions or concerns at this time. IV was discontinued, no redness, pain, or swelling noted at this time. Pt to leave unit with corrections officer and be taken to corrections facility.

## 2016-06-11 LAB — CULTURE, BLOOD (ROUTINE X 2)
Culture: NO GROWTH
Special Requests: ADEQUATE

## 2016-06-17 ENCOUNTER — Telehealth: Payer: Self-pay | Admitting: Student in an Organized Health Care Education/Training Program

## 2016-06-17 NOTE — Telephone Encounter (Signed)
N/A left message requesting to call back. -Mesha Guinyard

## 2016-10-05 ENCOUNTER — Emergency Department (HOSPITAL_COMMUNITY)
Admission: EM | Admit: 2016-10-05 | Discharge: 2016-10-05 | Disposition: A | Discharge status: Home / Self Care | Attending: Emergency Medicine | Admitting: Emergency Medicine

## 2016-10-05 ENCOUNTER — Encounter (HOSPITAL_COMMUNITY): Payer: Self-pay | Admitting: Emergency Medicine

## 2016-10-05 DIAGNOSIS — Y939 Activity, unspecified: Secondary | ICD-10-CM | POA: Insufficient documentation

## 2016-10-05 DIAGNOSIS — Y929 Unspecified place or not applicable: Secondary | ICD-10-CM | POA: Insufficient documentation

## 2016-10-05 DIAGNOSIS — Z5321 Procedure and treatment not carried out due to patient leaving prior to being seen by health care provider: Secondary | ICD-10-CM | POA: Insufficient documentation

## 2016-10-05 DIAGNOSIS — Y999 Unspecified external cause status: Secondary | ICD-10-CM | POA: Insufficient documentation
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
## 2016-10-05 NOTE — ED Notes (Signed)
Unable to locate to patient x 2

## 2016-10-05 NOTE — ED Notes (Signed)
Unable to locate patient in lobby 

## 2016-10-07 ENCOUNTER — Ambulatory Visit (HOSPITAL_COMMUNITY)
Admission: EM | Admit: 2016-10-07 | Discharge: 2016-10-07 | Disposition: A | Discharge status: Home / Self Care | Attending: Emergency Medicine | Admitting: Emergency Medicine

## 2016-10-07 ENCOUNTER — Encounter (HOSPITAL_COMMUNITY): Payer: Self-pay | Admitting: Emergency Medicine

## 2016-10-07 DIAGNOSIS — Z23 Encounter for immunization: Secondary | ICD-10-CM

## 2016-10-07 DIAGNOSIS — S01511A Laceration without foreign body of lip, initial encounter: Secondary | ICD-10-CM

## 2016-10-07 DIAGNOSIS — W500XXA Accidental hit or strike by another person, initial encounter: Secondary | ICD-10-CM

## 2016-10-07 MED ORDER — TETANUS-DIPHTH-ACELL PERTUSSIS 5-2.5-18.5 LF-MCG/0.5 IM SUSP
0.5000 mL | Freq: Once | INTRAMUSCULAR | Status: AC
  Administered 2016-10-07: 0.5 mL via INTRAMUSCULAR

## 2016-10-07 MED ORDER — TETANUS-DIPHTH-ACELL PERTUSSIS 5-2.5-18.5 LF-MCG/0.5 IM SUSP
INTRAMUSCULAR | Status: AC
  Filled 2016-10-07: qty 0.5
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
## 2016-10-07 NOTE — Discharge Instructions (Signed)
Keep clean with warm salt water. May be extended with a little peroxide. Watch for signs of infection such as redness and swelling and pain. This cannot be repaired at this time. We will need to follow-up with a surgeon or plastic surgeon as listed on this page.
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
## 2017-02-12 ENCOUNTER — Emergency Department (HOSPITAL_COMMUNITY): Payer: Self-pay

## 2017-02-12 ENCOUNTER — Other Ambulatory Visit: Payer: Self-pay

## 2017-02-12 ENCOUNTER — Encounter (HOSPITAL_COMMUNITY): Payer: Self-pay | Admitting: *Deleted

## 2017-02-12 ENCOUNTER — Inpatient Hospital Stay (HOSPITAL_COMMUNITY)
Admission: EM | Admit: 2017-02-12 | Discharge: 2017-02-14 | DRG: 158 | Discharge status: Against Medical Advice | Payer: Self-pay | Attending: Nephrology | Admitting: Nephrology

## 2017-02-12 DIAGNOSIS — F17210 Nicotine dependence, cigarettes, uncomplicated: Secondary | ICD-10-CM | POA: Diagnosis present

## 2017-02-12 DIAGNOSIS — F1120 Opioid dependence, uncomplicated: Secondary | ICD-10-CM | POA: Diagnosis present

## 2017-02-12 DIAGNOSIS — K047 Periapical abscess without sinus: Principal | ICD-10-CM | POA: Diagnosis present

## 2017-02-12 DIAGNOSIS — Z5321 Procedure and treatment not carried out due to patient leaving prior to being seen by health care provider: Secondary | ICD-10-CM | POA: Diagnosis not present

## 2017-02-12 DIAGNOSIS — F1920 Other psychoactive substance dependence, uncomplicated: Secondary | ICD-10-CM | POA: Diagnosis present

## 2017-02-12 DIAGNOSIS — L03211 Cellulitis of face: Secondary | ICD-10-CM | POA: Diagnosis present

## 2017-02-12 DIAGNOSIS — Z8659 Personal history of other mental and behavioral disorders: Secondary | ICD-10-CM

## 2017-02-12 DIAGNOSIS — Z79899 Other long term (current) drug therapy: Secondary | ICD-10-CM

## 2017-02-12 DIAGNOSIS — K029 Dental caries, unspecified: Secondary | ICD-10-CM | POA: Diagnosis present

## 2017-02-12 LAB — RAPID URINE DRUG SCREEN, HOSP PERFORMED
Amphetamines: NOT DETECTED
Barbiturates: NOT DETECTED
Benzodiazepines: NOT DETECTED
COCAINE: NOT DETECTED
OPIATES: POSITIVE — AB
Tetrahydrocannabinol: POSITIVE — AB

## 2017-02-12 LAB — URINALYSIS, ROUTINE W REFLEX MICROSCOPIC
BILIRUBIN URINE: NEGATIVE
Glucose, UA: NEGATIVE mg/dL
KETONES UR: NEGATIVE mg/dL
LEUKOCYTES UA: NEGATIVE
Nitrite: NEGATIVE
Protein, ur: NEGATIVE mg/dL
Specific Gravity, Urine: 1.005 (ref 1.005–1.030)
pH: 8 (ref 5.0–8.0)

## 2017-02-12 LAB — COMPREHENSIVE METABOLIC PANEL
ALK PHOS: 87 U/L (ref 38–126)
ALT: 18 U/L (ref 14–54)
ANION GAP: 6 (ref 5–15)
AST: 23 U/L (ref 15–41)
Albumin: 3.8 g/dL (ref 3.5–5.0)
BILIRUBIN TOTAL: 0.4 mg/dL (ref 0.3–1.2)
BUN: 11 mg/dL (ref 6–20)
CO2: 28 mmol/L (ref 22–32)
Calcium: 8.7 mg/dL — ABNORMAL LOW (ref 8.9–10.3)
Chloride: 101 mmol/L (ref 101–111)
Creatinine, Ser: 0.7 mg/dL (ref 0.44–1.00)
GFR calc non Af Amer: >60 mL/min (ref >60)
Glucose, Bld: 97 mg/dL (ref 65–99)
Potassium: 3.5 mmol/L (ref 3.5–5.1)
Sodium: 135 mmol/L (ref 135–145)
TOTAL PROTEIN: 7.7 g/dL (ref 6.5–8.1)

## 2017-02-12 LAB — CBC WITH DIFFERENTIAL/PLATELET
Basophils Absolute: 0 10*3/uL (ref 0.0–0.1)
Basophils Relative: 0 %
Eosinophils Absolute: 0.1 10*3/uL (ref 0.0–0.7)
Eosinophils Relative: 1 %
HCT: 36.3 % (ref 36.0–46.0)
HEMOGLOBIN: 11.8 g/dL — AB (ref 12.0–15.0)
LYMPHS ABS: 2.7 10*3/uL (ref 0.7–4.0)
Lymphocytes Relative: 22 %
MCH: 25.6 pg — AB (ref 26.0–34.0)
MCHC: 32.5 g/dL (ref 30.0–36.0)
MCV: 78.7 fL (ref 78.0–100.0)
MONO ABS: 1 10*3/uL (ref 0.1–1.0)
MONOS PCT: 8 %
NEUTROS PCT: 69 %
Neutro Abs: 8.6 10*3/uL — ABNORMAL HIGH (ref 1.7–7.7)
Platelets: 250 10*3/uL (ref 150–400)
RBC: 4.61 MIL/uL (ref 3.87–5.11)
RDW: 16.1 % — ABNORMAL HIGH (ref 11.5–15.5)
WBC: 12.4 10*3/uL — ABNORMAL HIGH (ref 4.0–10.5)

## 2017-02-12 LAB — I-STAT CG4 LACTIC ACID, ED: Lactic Acid, Venous: 1.41 mmol/L (ref 0.5–1.9)

## 2017-02-12 MED ORDER — MORPHINE SULFATE (PF) 4 MG/ML IV SOLN
4.0000 mg | Freq: Once | INTRAVENOUS | Status: AC
  Administered 2017-02-12: 4 mg via INTRAVENOUS
  Filled 2017-02-12: qty 1

## 2017-02-12 MED ORDER — ACETAMINOPHEN 650 MG RE SUPP: 650.0000 mg | Freq: Four times a day (QID) | RECTAL | Status: DC | PRN

## 2017-02-12 MED ORDER — MORPHINE SULFATE (PF) 2 MG/ML IV SOLN
2.0000 mg | INTRAVENOUS | Status: DC | PRN
  Administered 2017-02-12 – 2017-02-13 (×4): 4 mg via INTRAVENOUS
  Filled 2017-02-12 (×4): qty 2

## 2017-02-12 MED ORDER — SODIUM CHLORIDE 0.9 % IV SOLN
INTRAVENOUS | Status: DC
  Administered 2017-02-12: 22:00:00 via INTRAVENOUS

## 2017-02-12 MED ORDER — CLINDAMYCIN PHOSPHATE 600 MG/50ML IV SOLN: 600.0000 mg | Freq: Once | INTRAVENOUS | Status: DC

## 2017-02-12 MED ORDER — CLINDAMYCIN PHOSPHATE 600 MG/50ML IV SOLN
600.0000 mg | Freq: Once | INTRAVENOUS | Status: AC
  Administered 2017-02-12: 600 mg via INTRAVENOUS
  Filled 2017-02-12: qty 50

## 2017-02-12 MED ORDER — ONDANSETRON HCL 4 MG/2ML IJ SOLN: 4.0000 mg | Freq: Four times a day (QID) | INTRAMUSCULAR | Status: DC | PRN

## 2017-02-12 MED ORDER — SODIUM CHLORIDE 0.9 % IV BOLUS (SEPSIS)
1000.0000 mL | Freq: Once | INTRAVENOUS | Status: AC
  Administered 2017-02-12: 1000 mL via INTRAVENOUS

## 2017-02-12 MED ORDER — CLINDAMYCIN PHOSPHATE 600 MG/50ML IV SOLN
600.0000 mg | Freq: Three times a day (TID) | INTRAVENOUS | Status: DC
  Administered 2017-02-13: 600 mg via INTRAVENOUS
  Filled 2017-02-12 (×3): qty 50

## 2017-02-12 MED ORDER — IBUPROFEN 200 MG PO TABS
600.0000 mg | ORAL_TABLET | Freq: Once | ORAL | Status: AC
  Administered 2017-02-12: 600 mg via ORAL
  Filled 2017-02-12: qty 3

## 2017-02-12 MED ORDER — ACETAMINOPHEN 325 MG PO TABS: 650.0000 mg | ORAL_TABLET | Freq: Four times a day (QID) | ORAL | Status: DC | PRN

## 2017-02-12 MED ORDER — ONDANSETRON HCL 4 MG PO TABS: 4.0000 mg | ORAL_TABLET | Freq: Four times a day (QID) | ORAL | Status: DC | PRN

## 2017-02-12 MED ORDER — IOPAMIDOL (ISOVUE-300) INJECTION 61%
INTRAVENOUS | Status: AC
  Administered 2017-02-12: 75 mL
  Filled 2017-02-12: qty 75

## 2017-02-12 MED ORDER — ENOXAPARIN SODIUM 40 MG/0.4ML ~~LOC~~ SOLN
40.0000 mg | SUBCUTANEOUS | Status: DC
  Administered 2017-02-12 – 2017-02-13 (×2): 40 mg via SUBCUTANEOUS
  Filled 2017-02-12 (×3): qty 0.4

## 2017-02-12 NOTE — ED Notes (Signed)
ED TO INPATIENT HANDOFF REPORT  Name/Age/Gender Eileen Torres 32 y.o. female  Code Status    Code Status Orders  (From admission, onward)        Start     Ordered   02/12/17 2101  Full code  Continuous     02/12/17 2101    Code Status History    Date Active Date Inactive Code Status Order ID Comments User Context   06/06/2016 05:18 06/09/2016 17:08 Full Code [id number]  Bufford Lope, DO Inpatient   02/10/2016 03:56 02/11/2016 18:03 Full Code [id number]  Rogue Bussing, MD ED   08/16/2015 16:41 08/20/2015 18:51 Full Code [id number]  Nicolette Bang, DO Inpatient      Home/SNF/Other Home  Chief Complaint facial swelling; poss. abcess  Level of Care/Admitting Diagnosis ED Disposition    ED Disposition Condition [facility] Area: [facility] [100102]  Level of Care: Med-Surg [16]  Diagnosis: Dental infection [893810]  Admitting Physician: Etta Quill [[id number]]  Attending Physician: Etta Quill [[id number]]  PT Class (Do Not Modify): Observation [104]  PT Acc Code (Do Not Modify): Observation [10022]       Medical History Past Medical History:  Diagnosis Date  . AKI (acute kidney injury) ([place]) 06/2016  . Anxiety   . Bipolar disorder ([place])   . Childhood asthma   . Depression   . Encounter for Essure implantation 09/2011  . GERD (gastroesophageal reflux disease)   . Heroin abuse ([place])   . HSV (herpes simplex virus) infection 2009   ?outbreak could be HSV last pregnancy-not definate diagnosis per pt and chart- pt on Valtrex prophylacttcally  . Migraines    08/19/2015 "4-6 good ones/year"  . Polysubstance abuse ([place])   . Prolonged QT interval   . Pyelonephritis     Allergies No Known Allergies  IV Location/Drains/Wounds Patient Lines/Drains/Airways Status   Active Line/Drains/Airways    None          Labs/Imaging Results for orders placed or performed during the hospital encounter of 02/12/17 (from the  past 48 hour(s))  CBC with Differential     Status: Abnormal   Collection Time: 02/12/17  4:29 PM  Result Value Ref Range   WBC 12.4 (H) 4.0 - 10.5 K/uL   RBC 4.61 3.87 - 5.11 MIL/uL   Hemoglobin 11.8 (L) 12.0 - 15.0 g/dL   HCT 36.3 36.0 - 46.0 %   MCV 78.7 78.0 - 100.0 fL   MCH 25.6 (L) 26.0 - 34.0 pg   MCHC 32.5 30.0 - 36.0 g/dL   RDW 16.1 (H) 11.5 - 15.5 %   Platelets 250 150 - 400 K/uL   Neutrophils Relative % 69 %   Neutro Abs 8.6 (H) 1.7 - 7.7 K/uL   Lymphocytes Relative 22 %   Lymphs Abs 2.7 0.7 - 4.0 K/uL   Monocytes Relative 8 %   Monocytes Absolute 1.0 0.1 - 1.0 K/uL   Eosinophils Relative 1 %   Eosinophils Absolute 0.1 0.0 - 0.7 K/uL   Basophils Relative 0 %   Basophils Absolute 0.0 0.0 - 0.1 K/uL    Comment: Performed at [facility], [place] [address] [place], [place] [address]  Comprehensive metabolic panel     Status: Abnormal   Collection Time: 02/12/17  4:29 PM  Result Value Ref Range   Sodium 135 135 - 145 mmol/L   Potassium 3.5 3.5 - 5.1 mmol/L   Chloride 101 101 -  111 mmol/L   CO2 28 22 - 32 mmol/L   Glucose, Bld 97 65 - 99 mg/dL   BUN 11 6 - 20 mg/dL   Creatinine, Ser 0.70 0.44 - 1.00 mg/dL   Calcium 8.7 (L) 8.9 - 10.3 mg/dL   Total Protein 7.7 6.5 - 8.1 g/dL   Albumin 3.8 3.5 - 5.0 g/dL   AST 23 15 - 41 U/L   ALT 18 14 - 54 U/L   Alkaline Phosphatase 87 38 - 126 U/L   Total Bilirubin 0.4 0.3 - 1.2 mg/dL   GFR calc non Af Amer >60 >60 mL/min   GFR calc Af Amer >60 >60 mL/min    Comment: (NOTE) The eGFR has been calculated using the CKD EPI equation. This calculation has not been validated in all clinical situations. eGFR's persistently <60 mL/min signify possible Chronic Kidney Disease.    Anion gap 6 5 - 15    Comment: Performed at [facility], [place] [address], [place], [place] [address]  Urine rapid drug screen (hosp performed)     Status: Abnormal   Collection Time: 02/12/17  4:29 PM  Result Value Ref  Range   Opiates POSITIVE (A) NONE DETECTED   Cocaine NONE DETECTED NONE DETECTED   Benzodiazepines NONE DETECTED NONE DETECTED   Amphetamines NONE DETECTED NONE DETECTED   Tetrahydrocannabinol POSITIVE (A) NONE DETECTED   Barbiturates NONE DETECTED NONE DETECTED    Comment: (NOTE) DRUG SCREEN FOR MEDICAL PURPOSES ONLY.  IF CONFIRMATION IS NEEDED FOR ANY PURPOSE, NOTIFY LAB WITHIN 5 DAYS. LOWEST DETECTABLE LIMITS FOR URINE DRUG SCREEN Drug Class                     Cutoff (ng/mL) Amphetamine and metabolites    1000 Barbiturate and metabolites    200 Benzodiazepine                 026 Tricyclics and metabolites     300 Opiates and metabolites        300 Cocaine and metabolites        300 THC                            50 Performed at [facility], [place] [address], [place], [place] [address]   Urinalysis, Routine w reflex microscopic     Status: Abnormal   Collection Time: 02/12/17  4:29 PM  Result Value Ref Range   Color, Urine STRAW (A) YELLOW   APPearance CLEAR CLEAR   Specific Gravity, Urine 1.005 1.005 - 1.030   pH 8.0 5.0 - 8.0   Glucose, UA NEGATIVE NEGATIVE mg/dL   Hgb urine dipstick SMALL (A) NEGATIVE   Bilirubin Urine NEGATIVE NEGATIVE   Ketones, ur NEGATIVE NEGATIVE mg/dL   Protein, ur NEGATIVE NEGATIVE mg/dL   Nitrite NEGATIVE NEGATIVE   Leukocytes, UA NEGATIVE NEGATIVE   RBC / HPF 0-5 0 - 5 RBC/hpf   WBC, UA 0-5 0 - 5 WBC/hpf   Bacteria, UA RARE (A) NONE SEEN   Squamous Epithelial / LPF 0-5 (A) NONE SEEN   Mucus PRESENT     Comment: Performed at [facility], [place] [address] [place], [place] [address]  I-Stat CG4 Lactic Acid, ED     Status: None   Collection Time: 02/12/17  4:52 PM  Result Value Ref Range   Lactic Acid, Venous 1.41 0.5 - 1.9 mmol/L   Ct  Soft Tissue Neck W Contrast  Result Date: 02/12/2017 CLINICAL DATA:  IV drug use. Right-sided facial swelling with progression over the last 24 hours. Trismus. EXAM: CT  NECK WITH CONTRAST TECHNIQUE: Multidetector CT imaging of the neck was performed using the standard protocol following the bolus administration of intravenous contrast. CONTRAST:  56m ISOVUE-300 IOPAMIDOL (ISOVUE-300) INJECTION 61% COMPARISON:  CT head and face 11/06/2013. FINDINGS: Pharynx and larynx: There is moderate prominence of the palatine tonsils bilaterally. No focal abscess is present. Lingual tonsils are within normal limits. The nasopharynx is clear. The hypopharynx is unremarkable. Epiglottis is within normal limits. Vocal cords are midline and symmetric. The trachea is within normal limits. Salivary glands: Inflammatory changes extend to the right parotid gland without involvement of gland. Inflammatory changes also extend to the right submandibular gland. The gland is normal. Left submandibular and parotid glands are within normal limits. Thyroid: Within normal limits. Lymph nodes: Reactive type right level 1 B and level 2 lymph nodes are present. Vascular: No focal vascular lesions are present. Limited intracranial: Within normal limits. Visualized orbits: Unremarkable. Mastoids and visualized paranasal sinuses: Clear Skeleton: Vertebral body heights and alignment are normal. No focal lytic or blastic lesions are present. Upper chest: The lung apices are clear. Thoracic inlet is within normal limits. Other: A large dental caries is noted in the second right maxillary molar. Periapical lucencies are present. This appears to be the epicenter of marked inflammatory changes over the right side of the face. Lateral tooth root extends beyond the cortex. Extensive inflammatory changes present within the subcutaneous tissues of the right side of the face from the inferior orbit through the mandible. Inflammatory changes extend to the right parotid gland and into the right submandibular gland. There is no significant is inflammation above the mylohyoid. Inflammatory changes surround the right masseter muscle  which may result in trismus. IMPRESSION: 1. Extensive inflammatory changes about the right side of the face appear to be centered at the right maxilla where there is a large dental caries of the second right maxillary molar and periapical lucency with extension through the cortex laterally. 2. No discrete abscess. 3. Reactive right level 1 and level 2 adenopathy. 4. Prominent palatine tonsils bilaterally may be associated with acute pharyngitis. There is no discrete abscess. Electronically Signed   By: CSan MorelleM.D.   On: 02/12/2017 19:38    Pending Labs Unresulted Labs (From admission, onward)   Start     Ordered   02/13/17 0500  CBC  Tomorrow morning,   R     02/12/17 2101   02/13/17 [id number] Basic metabolic panel  Tomorrow morning,   R     02/12/17 2101   02/12/17 2050  Culture, blood (single)  Once,   R    Comments:  Get second BCx.    02/12/17 2050   02/12/17 1337  Blood culture (routine x 2)  BLOOD CULTURE X 2,   STAT,   Status:  Canceled     02/12/17 1336      Vitals/Pain Today's Vitals   02/12/17 1730 02/12/17 1730 02/12/17 1745 02/12/17 2056  BP:   107/68 96/67  Pulse: 76  79 (!) 54  Resp: 17  12 13   Temp:      TempSrc:      SpO2: 100%  99% 99%  Weight:      Height:      PainSc:  10-Worst pain ever      Isolation Precautions No active isolations  Medications Medications  morphine 2 MG/ML injection 2-4 mg (not administered)  clindamycin (CLEOCIN) IVPB 600 mg (not administered)  acetaminophen (TYLENOL) tablet 650 mg (not administered)    Or  acetaminophen (TYLENOL) suppository 650 mg (not administered)  ondansetron (ZOFRAN) tablet 4 mg (not administered)    Or  ondansetron (ZOFRAN) injection 4 mg (not administered)  enoxaparin (LOVENOX) injection 40 mg (not administered)  0.9 %  sodium chloride infusion (not administered)  sodium chloride 0.9 % bolus 1,000 mL (0 mLs Intravenous Stopped 02/12/17 1758)  ibuprofen (ADVIL,MOTRIN) tablet 600 mg (600 mg Oral  Given 02/12/17 1639)  morphine 4 MG/ML injection 4 mg (4 mg Intravenous Given 02/12/17 1707)  clindamycin (CLEOCIN) IVPB 600 mg (0 mg Intravenous Stopped 02/12/17 1800)  iopamidol (ISOVUE-300) 61 % injection (75 mLs  Contrast Given 02/12/17 1916)    Mobility walks

## 2017-02-12 NOTE — H&P (Signed)
History and Physical    Eileen Torres ZOX:[id number] DOB: 05/21/1985 DOA: 02/12/2017  PCP: Howard PouchFeng, Lauren, MD  Patient coming from: Home  I have personally briefly reviewed patient's old medical records in [facility]  Chief Complaint: Face swelling  HPI: Eileen Torres is a 32 y.o. female with medical history significant of IVDU, heroin abuse.  Patient presents to the ED with R faical swelling, onset yesterday, worse today.  No fever, chills, N/V/D.  Painful.  Foul smelling drainage.   ED Course: Started on clinda.  CT demonstrates no abscess nor airway effacement.  Seems to be coming from dental cavity.   Review of Systems: As per HPI otherwise 10 point review of systems negative.   Past Medical History:  Diagnosis Date  . AKI (acute kidney injury) (HCC) 06/2016  . Anxiety   . Bipolar disorder (HCC)   . Childhood asthma   . Depression   . Encounter for Essure implantation 09/2011  . GERD (gastroesophageal reflux disease)   . Heroin abuse (HCC)   . HSV (herpes simplex virus) infection 2009   ?outbreak could be HSV last pregnancy-not definate diagnosis per pt and chart- pt on Valtrex prophylacttcally  . Migraines    08/19/2015 "4-6 good ones/year"  . Polysubstance abuse (HCC)   . Prolonged QT interval   . Pyelonephritis     Past Surgical History:  Procedure Laterality Date  . DILITATION & CURRETTAGE/HYSTROSCOPY WITH ESSURE  2013     reports that she has been smoking cigarettes.  She has a 4.75 pack-year smoking history. she has never used smokeless tobacco. She reports that she drinks alcohol. She reports that she uses drugs.  No Known Allergies  Family History  Problem Relation Age of Onset  . Cancer Other   . Diabetes Other   . Heart failure Other   . Stroke Other      Prior to Admission medications   Medication Sig Start Date End Date Taking? Authorizing Provider  acetaminophen (TYLENOL) 325 MG tablet Take 650 mg by mouth 3 (three) times daily as  needed for moderate pain.   Yes [provider]  cloNIDine (CATAPRES - DOSED IN MG/24 HR) 0.1 mg/24hr patch Place 0.1 mg onto the skin once a week.   Yes [provider]  pantoprazole (PROTONIX) 40 MG tablet Take 1 tablet (40 mg total) by mouth daily. Patient not taking: Reported on 02/12/2017 06/10/16   Leland HerYoo, Elsia J, DO  prochlorperazine (COMPAZINE) 5 MG tablet Take 1 tablet (5 mg total) by mouth every 6 (six) hours as needed for nausea or vomiting. Patient not taking: Reported on 02/12/2017 06/09/16   Leland HerYoo, Elsia J, DO  thiamine (VITAMIN B-1) 100 MG tablet Take 1 tablet (100 mg total) by mouth every morning. Patient not taking: Reported on 02/12/2017 06/09/16   Leland HerYoo, Elsia J, DO    Physical Exam: Vitals:   02/12/17 1715 02/12/17 1730 02/12/17 1745 02/12/17 2056  BP:   107/68 96/67  Pulse: 81 76 79 (!) 54  Resp: 11 17 12 13   Temp:      TempSrc:      SpO2: 100% 100% 99% 99%  Weight:      Height:        Constitutional: NAD, calm, comfortable Eyes: PERRL, lids and conjunctivae normal ENMT: Mucous membranes are moist. Posterior pharynx clear of any exudate or lesions.Normal dentition.  Neck: normal, supple, no masses, no thyromegaly Respiratory: clear to auscultation bilaterally, no wheezing, no crackles. Normal respiratory effort.  No accessory muscle use.  Cardiovascular: Murmur present Abdomen: no tenderness, no masses palpated. No hepatosplenomegaly. Bowel sounds positive.  Musculoskeletal: no clubbing / cyanosis. No joint deformity upper and lower extremities. Good ROM, no contractures. Normal muscle tone.  Skin:      Neurologic: CN 2-12 grossly intact. Sensation intact, DTR normal. Strength 5/5 in all 4.  Psychiatric: Normal judgment and insight. Alert and oriented x 3. Normal mood.    Labs on Admission: I have personally reviewed following labs and imaging studies  CBC: Recent Labs  Lab 02/12/17 1629  WBC 12.4*  NEUTROABS 8.6*  HGB 11.8*  HCT 36.3  MCV  78.7  PLT 250   Basic Metabolic Panel: Recent Labs  Lab 02/12/17 1629  NA 135  K 3.5  CL 101  CO2 28  GLUCOSE 97  BUN 11  CREATININE 0.70  CALCIUM 8.7*   GFR: Estimated Creatinine Clearance: 82.7 mL/min (by C-G formula based on SCr of 0.7 mg/dL). Liver Function Tests: Recent Labs  Lab 02/12/17 1629  AST 23  ALT 18  ALKPHOS 87  BILITOT 0.4  PROT 7.7  ALBUMIN 3.8   No results for input(s): LIPASE, AMYLASE in the last 168 hours. No results for input(s): AMMONIA in the last 168 hours. Coagulation Profile: No results for input(s): INR, PROTIME in the last 168 hours. Cardiac Enzymes: No results for input(s): CKTOTAL, CKMB, CKMBINDEX, TROPONINI in the last 168 hours. BNP (last 3 results) No results for input(s): PROBNP in the last 8760 hours. HbA1C: No results for input(s): HGBA1C in the last 72 hours. CBG: No results for input(s): GLUCAP in the last 168 hours. Lipid Profile: No results for input(s): CHOL, HDL, LDLCALC, TRIG, CHOLHDL, LDLDIRECT in the last 72 hours. Thyroid Function Tests: No results for input(s): TSH, T4TOTAL, FREET4, T3FREE, THYROIDAB in the last 72 hours. Anemia Panel: No results for input(s): VITAMINB12, FOLATE, FERRITIN, TIBC, IRON, RETICCTPCT in the last 72 hours. Urine analysis:    Component Value Date/Time   COLORURINE STRAW (A) 02/12/2017 1629   APPEARANCEUR CLEAR 02/12/2017 1629   LABSPEC 1.005 02/12/2017 1629   PHURINE 8.0 02/12/2017 1629   GLUCOSEU NEGATIVE 02/12/2017 1629   HGBUR SMALL (A) 02/12/2017 1629   BILIRUBINUR NEGATIVE 02/12/2017 1629   BILIRUBINUR SMALL 08/28/2015 1420   KETONESUR NEGATIVE 02/12/2017 1629   PROTEINUR NEGATIVE 02/12/2017 1629   UROBILINOGEN 1.0 08/28/2015 1420   UROBILINOGEN 0.2 10/08/2013 1912   NITRITE NEGATIVE 02/12/2017 1629   LEUKOCYTESUR NEGATIVE 02/12/2017 1629    Radiological Exams on Admission: Ct Soft Tissue Neck W Contrast  Result Date: 02/12/2017 CLINICAL DATA:  IV drug use. Right-sided  facial swelling with progression over the last 24 hours. Trismus. EXAM: CT NECK WITH CONTRAST TECHNIQUE: Multidetector CT imaging of the neck was performed using the standard protocol following the bolus administration of intravenous contrast. CONTRAST:  75mL ISOVUE-300 IOPAMIDOL (ISOVUE-300) INJECTION 61% COMPARISON:  CT head and face 11/06/2013. FINDINGS: Pharynx and larynx: There is moderate prominence of the palatine tonsils bilaterally. No focal abscess is present. Lingual tonsils are within normal limits. The nasopharynx is clear. The hypopharynx is unremarkable. Epiglottis is within normal limits. Vocal cords are midline and symmetric. The trachea is within normal limits. Salivary glands: Inflammatory changes extend to the right parotid gland without involvement of gland. Inflammatory changes also extend to the right submandibular gland. The gland is normal. Left submandibular and parotid glands are within normal limits. Thyroid: Within normal limits. Lymph nodes: Reactive type right level 1 B and level  2 lymph nodes are present. Vascular: No focal vascular lesions are present. Limited intracranial: Within normal limits. Visualized orbits: Unremarkable. Mastoids and visualized paranasal sinuses: Clear Skeleton: Vertebral body heights and alignment are normal. No focal lytic or blastic lesions are present. Upper chest: The lung apices are clear. Thoracic inlet is within normal limits. Other: A large dental caries is noted in the second right maxillary molar. Periapical lucencies are present. This appears to be the epicenter of marked inflammatory changes over the right side of the face. Lateral tooth root extends beyond the cortex. Extensive inflammatory changes present within the subcutaneous tissues of the right side of the face from the inferior orbit through the mandible. Inflammatory changes extend to the right parotid gland and into the right submandibular gland. There is no significant is inflammation  above the mylohyoid. Inflammatory changes surround the right masseter muscle which may result in trismus. IMPRESSION: 1. Extensive inflammatory changes about the right side of the face appear to be centered at the right maxilla where there is a large dental caries of the second right maxillary molar and periapical lucency with extension through the cortex laterally. 2. No discrete abscess. 3. Reactive right level 1 and level 2 adenopathy. 4. Prominent palatine tonsils bilaterally may be associated with acute pharyngitis. There is no discrete abscess. Electronically Signed   By: Marin Roberts M.D.   On: 02/12/2017 19:38    EKG: Independently reviewed.  Assessment/Plan Principal Problem:   Dental infection Active Problems:   Polysubstance (including opioids) dependence with physiological dependence (HCC)    1. Dental infection - 1. Continue clinda 600mg  IV Q8H 2. IVF: NS at 73 3. Soft diet 4. Pain control with morphine 2. Opiate abuse - 1. Murmur present in IVDU 2. BCx pending 3. 2d echo pending  DVT prophylaxis: Lovenox Code Status: Full Family Communication: No family in room Disposition Plan: Home after admit Consults called: None Admission status: Place in obs   Jaken Fregia, Heywood Iles. DO [facility] Pager [phone]  If 7AM-7PM, please contact day team taking care of patient [email or web address] Password [email or web address]  02/12/2017, 9:07 PM

## 2017-02-12 NOTE — ED Provider Notes (Signed)
[facility]-EMERGENCY DEPT Provider Note   CSN: [id number] Arrival date & time: 02/12/17  1256     History   Chief Complaint Chief Complaint  Patient presents with  . Abscess    HPI Eileen Torres is a 32 y.o. female with a history of bipolar disorder, polysubstance abuse, prolonged QT, who presents today for evaluation of facial swelling.  She reports that the right side of her face began swelling yesterday.  She reports generally feeling hot, however denies any fevers or chills, no nausea/vomiting/diarrhea.  She denies any difficulty swallowing or eating.  She reports that the area is very painful.  She reports that the pain is 10 out of 10.  She reports that her last heroin or opioid consumption was yesterday when she injected into her right forearm.  She reports that she has had drainage from the lower of the 2 scabs on her face that was foul-smelling.  She denies any history of anything similar in the past.  She reports that she does have dental problems, however her teeth have not been hurting her more recently.  Denies any recent antibiotic use.  HPI  Past Medical History:  Diagnosis Date  . AKI (acute kidney injury) (HCC) 06/2016  . Anxiety   . Bipolar disorder (HCC)   . Childhood asthma   . Depression   . Encounter for Essure implantation 09/2011  . GERD (gastroesophageal reflux disease)   . Heroin abuse (HCC)   . HSV (herpes simplex virus) infection 2009   ?outbreak could be HSV last pregnancy-not definate diagnosis per pt and chart- pt on Valtrex prophylacttcally  . Migraines    08/19/2015 "4-6 good ones/year"  . Polysubstance abuse (HCC)   . Prolonged QT interval   . Pyelonephritis     Patient Active Problem List   Diagnosis Date Noted  . Acute renal failure (HCC) 06/06/2016  . Alcohol abuse   . Dehydration   . Heroin withdrawal (HCC)   . Polysubstance (including opioids) dependence with physiological dependence (HCC)   . AKI (acute  kidney injury) (HCC)   . Cocaine abuse (HCC) 08/16/2015  . Pyelonephritis   . Sepsis (HCC)   . 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 DILITATION & CURRETTAGE/HYSTROSCOPY WITH ESSURE  2013    OB History    Gravida Para Term Preterm AB Living   2 2 1 1   2    SAB TAB Ectopic Multiple Live Births           1       Home Medications    Prior to Admission medications   Medication Sig Start Date End Date Taking? Authorizing Provider  acetaminophen (TYLENOL) 325 MG tablet Take 650 mg by mouth 3 (three) times daily as needed for moderate pain.   Yes [provider]  cloNIDine (CATAPRES - DOSED IN MG/24 HR) 0.1 mg/24hr patch Place 0.1 mg onto the skin once a week.   Yes [provider]  pantoprazole (PROTONIX) 40 MG tablet Take 1 tablet (40 mg total) by mouth daily. Patient not taking: Reported on 02/12/2017 06/10/16   Leland Her, DO  prochlorperazine (COMPAZINE) 5 MG tablet Take 1 tablet (5 mg total) by mouth every 6 (six) hours as needed for nausea or vomiting. Patient not taking: Reported on 02/12/2017 06/09/16   Leland Her, DO  thiamine (VITAMIN B-1) 100 MG tablet Take 1 tablet (100 mg total) by mouth every morning.  Patient not taking: Reported on 02/12/2017 06/09/16   Leland Her, DO    Family History Family History  Problem Relation Age of Onset  . Cancer Other   . Diabetes Other   . Heart failure Other   . Stroke Other     Social History Social History   Tobacco Use  . Smoking status: Current Every Day Smoker    Packs/day: 0.25    Years: 19.00    Pack years: 4.75    Types: Cigarettes  . Smokeless tobacco: Never Used  Substance Use Topics  . Alcohol use: Yes  . Drug use: Yes    Comment: roxicodone, heroin, cocaine     Allergies   Patient has no known allergies.   Review of Systems Review of Systems  Constitutional: Positive for fever (Feels warm).  HENT: Positive for dental problem, ear pain, facial  swelling and voice change (Hoarse). Negative for congestion, drooling, sinus pressure, sinus pain, sore throat and trouble swallowing.   Eyes: Negative for visual disturbance.  Respiratory: Negative for chest tightness and shortness of breath.   Cardiovascular: Negative for chest pain.  Gastrointestinal: Negative for diarrhea, nausea and vomiting.  Genitourinary: Negative for dysuria.  Skin: Positive for color change and wound.  Neurological: Negative for headaches.  Psychiatric/Behavioral: Negative for confusion.  All other systems reviewed and are negative.    Physical Exam Updated Vital Signs BP 107/68   Pulse 79   Temp 100.2 F (37.9 C) (Oral)   Resp 12   Ht 5\' 3"  (1.6 m)   Wt 54.4 kg (120 lb)   SpO2 99%   BMI 21.26 kg/m   Physical Exam  Constitutional: She appears well-developed and well-nourished. No distress.  HENT:  Head: Normocephalic.  Mouth/Throat: Oropharynx is clear and moist.  There is a large area of swelling on patient's right lower face.  There is localized induration and erythema around 2 small wounds superior to the right labial commisure, please see clinical image.  The swelling extends below jaw line.  There is diffuse poor dentition.    Eyes: Conjunctivae are normal. Right eye exhibits no discharge. Left eye exhibits no discharge. No scleral icterus.  Neck: Normal range of motion. Neck supple. No tracheal deviation present.  Cardiovascular: Normal rate, regular rhythm and intact distal pulses.  Murmur heard. Pulmonary/Chest: Effort normal and breath sounds normal. No stridor. No respiratory distress.  Abdominal: Soft. She exhibits no distension. There is no tenderness.  Musculoskeletal: She exhibits no edema or deformity.  Lymphadenopathy:    She has no cervical adenopathy.  Neurological: She is alert. She exhibits normal muscle tone.  Skin: Skin is warm and dry. She is not diaphoretic.  Psychiatric: She has a normal mood and affect. Her behavior is  normal.  Nursing note and vitals reviewed.        ED Treatments / Results  Labs (all labs ordered are listed, but only abnormal results are displayed) Labs Reviewed  CBC WITH DIFFERENTIAL/PLATELET - Abnormal; Notable for the following components:      Result Value   WBC 12.4 (*)    Hemoglobin 11.8 (*)    MCH 25.6 (*)    RDW 16.1 (*)    Neutro Abs 8.6 (*)    All other components within normal limits  COMPREHENSIVE METABOLIC PANEL - Abnormal; Notable for the following components:   Calcium 8.7 (*)    All other components within normal limits  RAPID URINE DRUG SCREEN, HOSP PERFORMED - Abnormal; Notable for the  following components:   Opiates POSITIVE (*)    Tetrahydrocannabinol POSITIVE (*)    All other components within normal limits  URINALYSIS, ROUTINE W REFLEX MICROSCOPIC - Abnormal; Notable for the following components:   Color, Urine STRAW (*)    Hgb urine dipstick SMALL (*)    Bacteria, UA RARE (*)    Squamous Epithelial / LPF 0-5 (*)    All other components within normal limits  CULTURE, BLOOD (ROUTINE X 2)  I-STAT CG4 LACTIC ACID, ED  I-STAT BETA HCG BLOOD, ED ([facility], [facility], [facility] ONLY)  I-STAT CG4 LACTIC ACID, ED    EKG  EKG Interpretation None       Radiology Ct Soft Tissue Neck W Contrast  Result Date: 02/12/2017 CLINICAL DATA:  IV drug use. Right-sided facial swelling with progression over the last 24 hours. Trismus. EXAM: CT NECK WITH CONTRAST TECHNIQUE: Multidetector CT imaging of the neck was performed using the standard protocol following the bolus administration of intravenous contrast. CONTRAST:  75mL ISOVUE-300 IOPAMIDOL (ISOVUE-300) INJECTION 61% COMPARISON:  CT head and face 11/06/2013. FINDINGS: Pharynx and larynx: There is moderate prominence of the palatine tonsils bilaterally. No focal abscess is present. Lingual tonsils are within normal limits. The nasopharynx is clear. The hypopharynx is unremarkable. Epiglottis is within normal limits. Vocal cords  are midline and symmetric. The trachea is within normal limits. Salivary glands: Inflammatory changes extend to the right parotid gland without involvement of gland. Inflammatory changes also extend to the right submandibular gland. The gland is normal. Left submandibular and parotid glands are within normal limits. Thyroid: Within normal limits. Lymph nodes: Reactive type right level 1 B and level 2 lymph nodes are present. Vascular: No focal vascular lesions are present. Limited intracranial: Within normal limits. Visualized orbits: Unremarkable. Mastoids and visualized paranasal sinuses: Clear Skeleton: Vertebral body heights and alignment are normal. No focal lytic or blastic lesions are present. Upper chest: The lung apices are clear. Thoracic inlet is within normal limits. Other: A large dental caries is noted in the second right maxillary molar. Periapical lucencies are present. This appears to be the epicenter of marked inflammatory changes over the right side of the face. Lateral tooth root extends beyond the cortex. Extensive inflammatory changes present within the subcutaneous tissues of the right side of the face from the inferior orbit through the mandible. Inflammatory changes extend to the right parotid gland and into the right submandibular gland. There is no significant is inflammation above the mylohyoid. Inflammatory changes surround the right masseter muscle which may result in trismus. IMPRESSION: 1. Extensive inflammatory changes about the right side of the face appear to be centered at the right maxilla where there is a large dental caries of the second right maxillary molar and periapical lucency with extension through the cortex laterally. 2. No discrete abscess. 3. Reactive right level 1 and level 2 adenopathy. 4. Prominent palatine tonsils bilaterally may be associated with acute pharyngitis. There is no discrete abscess. Electronically Signed   By: Marin Robertshristopher  Mattern M.D.   On:  02/12/2017 19:38    Procedures Procedures (including critical care time)  Medications Ordered in ED Medications  sodium chloride 0.9 % bolus 1,000 mL (0 mLs Intravenous Stopped 02/12/17 1758)  ibuprofen (ADVIL,MOTRIN) tablet 600 mg (600 mg Oral Given 02/12/17 1639)  morphine 4 MG/ML injection 4 mg (4 mg Intravenous Given 02/12/17 1707)  clindamycin (CLEOCIN) IVPB 600 mg (0 mg Intravenous Stopped 02/12/17 1800)  iopamidol (ISOVUE-300) 61 % injection (75 mLs  Contrast Given  02/12/17 1916)     Initial Impression / Assessment and Plan / ED Course  I have reviewed the triage vital signs and the nursing notes.  Pertinent labs & imaging results that were available during my care of the patient were reviewed by me and considered in my medical decision making (see chart for details).  Clinical Course as of Feb 12 2017  Sun Feb 12, 2017  1726 I-stat results viewed in lab, patient is not pregnant.   [EH]    Clinical Course User Index [EH] Cristina Gong, PA-C   [name] Faulkner presents today for evaluation of facial swelling for approximately 1 day.  This is obviously swollen with induration localized around the right upper lip.  There is no fluctuance.  No fevers or chills, she is not tachycardic or tachypneic, hemodynamically stable.  Screening labs and blood cultures were obtained.  White count minimally elevated at 12.4 with neutrophil count of 8.6, mild anemia with hemoglobin of 11.8.  CMP obtained and reviewed, UDS positive for opioids and cannabis.  Patient does admit to using IV heroin.  Urine appears contaminated, not clearly consistent with UTI, based on lack of symptoms will not treat.  CT with contrast of her neck was obtained, which did not show any localized abscess, rather findings suggestive of dental infection with extensive surrounding cellulitis.  She was given a dose of clindamycin while in the emergency room, and her pain was treated with morphine.  She was given fluids.  She  did not meet Sirs or sepsis criteria.  Hospitalist Dr. Julian Reil was consulted for admission who agreed to admit patient.    Final Clinical Impressions(s) / ED Diagnoses   Final diagnoses:  Cellulitis of face  Dental infection    ED Discharge Orders    None       Norman Clay 02/13/17 0009    Raeford Razor, MD 02/14/17 [phone]

## 2017-02-12 NOTE — ED Notes (Signed)
Bed: WA06 Expected date:  Expected time:  Means of arrival:  Comments: 

## 2017-02-12 NOTE — ED Provider Notes (Signed)
MSE was initiated and I personally evaluated the patient and placed orders (if any) at  1:36 PM on February 12, 2017.  The patient appears stable so that the remainder of the MSE may be completed by another provider.  32 year old female with a history of IV drug use and Staphylococcus bacteremia presenting with significant right sided facial swelling that is significantly worsened in the last 24 hours.  Her voice is muffled and she has 2 finger trismus.  The right cheek is indurated and tender to palpation and she is tender to the right anterolateral neck.  She denies difficulty breathing and is in no respiratory distress on evaluation.  Given her history of bacteremia and IV drug use, CBC with differential, CMP, lactate, and blood cultures have been ordered. She will also likely need CT of the soft tissues of the neck, pending lab results.    Barkley BoardsMcDonald, Nathalie Cavendish A, PA-C 02/12/17 1339    Bethann BerkshireZammit, Joseph, MD 02/16/17 [phone]

## 2017-02-12 NOTE — ED Triage Notes (Signed)
Pt has small red area on rt side of lip with large amount of rt sided facial swelling. Painful no airway compromise

## 2017-02-12 NOTE — ED Notes (Signed)
Pt aware that urine sample is needed.  

## 2017-02-13 ENCOUNTER — Observation Stay (HOSPITAL_BASED_OUTPATIENT_CLINIC_OR_DEPARTMENT_OTHER): Payer: Self-pay

## 2017-02-13 DIAGNOSIS — R011 Cardiac murmur, unspecified: Secondary | ICD-10-CM

## 2017-02-13 DIAGNOSIS — K047 Periapical abscess without sinus: Principal | ICD-10-CM

## 2017-02-13 LAB — BASIC METABOLIC PANEL
ANION GAP: 10 (ref 5–15)
BUN: 13 mg/dL (ref 6–20)
CALCIUM: 8.8 mg/dL — AB (ref 8.9–10.3)
CO2: 23 mmol/L (ref 22–32)
CREATININE: 0.62 mg/dL (ref 0.44–1.00)
Chloride: 105 mmol/L (ref 101–111)
GFR calc Af Amer: >60 mL/min (ref >60)
GLUCOSE: 110 mg/dL — AB (ref 65–99)
Potassium: 3.6 mmol/L (ref 3.5–5.1)
Sodium: 138 mmol/L (ref 135–145)

## 2017-02-13 LAB — CBC
HCT: 33.4 % — ABNORMAL LOW (ref 36.0–46.0)
Hemoglobin: 10.9 g/dL — ABNORMAL LOW (ref 12.0–15.0)
MCH: 25.6 pg — ABNORMAL LOW (ref 26.0–34.0)
MCHC: 32.6 g/dL (ref 30.0–36.0)
MCV: 78.6 fL (ref 78.0–100.0)
PLATELETS: 213 10*3/uL (ref 150–400)
RBC: 4.25 MIL/uL (ref 3.87–5.11)
RDW: 16.2 % — AB (ref 11.5–15.5)
WBC: 9.7 10*3/uL (ref 4.0–10.5)

## 2017-02-13 LAB — ECHOCARDIOGRAM COMPLETE
Height: 63 in
Weight: 1920 oz

## 2017-02-13 LAB — I-STAT BETA HCG BLOOD, ED (NOT ORDERABLE): I-stat hCG, quantitative: <5 m[IU]/mL (ref <5)

## 2017-02-13 MED ORDER — IBUPROFEN 400 MG PO TABS
600.0000 mg | ORAL_TABLET | ORAL | Status: DC | PRN
  Administered 2017-02-13 – 2017-02-14 (×4): 600 mg via ORAL
  Filled 2017-02-13 (×4): qty 1

## 2017-02-13 MED ORDER — SODIUM CHLORIDE 0.9% FLUSH: 10.0000 mL | INTRAVENOUS | Status: DC | PRN

## 2017-02-13 MED ORDER — NICOTINE 14 MG/24HR TD PT24
14.0000 mg | MEDICATED_PATCH | Freq: Every day | TRANSDERMAL | Status: DC
  Administered 2017-02-13: 14:00:00 14 mg via TRANSDERMAL
  Filled 2017-02-13: qty 1

## 2017-02-13 MED ORDER — AMOXICILLIN-POT CLAVULANATE 875-125 MG PO TABS
1.0000 | ORAL_TABLET | Freq: Two times a day (BID) | ORAL | Status: DC
  Administered 2017-02-13: 1 via ORAL
  Filled 2017-02-13: qty 1

## 2017-02-13 MED ORDER — HYDROMORPHONE HCL 2 MG PO TABS
1.0000 mg | ORAL_TABLET | ORAL | Status: DC | PRN
  Administered 2017-02-13 – 2017-02-14 (×6): 2 mg via ORAL
  Filled 2017-02-13 (×6): qty 1

## 2017-02-13 MED ORDER — MORPHINE SULFATE (PF) 2 MG/ML IV SOLN: 2.0000 mg | INTRAVENOUS | Status: DC | PRN

## 2017-02-13 NOTE — Progress Notes (Signed)
[facility] Progress Note  Subjective: feeling some better  Vitals:   02/12/17 2100 02/12/17 2206 02/13/17 0530 02/13/17 1410  BP: 90/61 108/78 112/65 111/63  Pulse: 62 77 69 70  Resp: 14 19 18 18   Temp:  98 F (36.7 C) 98 F (36.7 C) 98.6 F (37 C)  TempSrc:  Oral Oral Oral  SpO2: 98% 93% 100% 100%  Weight:      Height:        Inpatient medications: . enoxaparin (LOVENOX) injection  40 mg Subcutaneous Q24H  . nicotine  14 mg Transdermal Daily   . sodium chloride 75 mL/hr at 02/12/17 2218  . clindamycin (CLEOCIN) IV 600 mg (02/13/17 0722)   acetaminophen **OR** acetaminophen, HYDROmorphone, ibuprofen, morphine injection, ondansetron **OR** ondansetron (ZOFRAN) IV, sodium chloride flush  Exam: Alert, no distress No jvd Facial swelling on R, dec'd from photo yest Throat clear Chest cta bilat RRR no mrg Abd soft ntnd  Ext no edema    Brief Summary: 32 y.o. female with medical history significant of IVDU, heroin abuse.  Patient presented to the ED with R faical swelling, onset 24 hr, getting worse.  No fever, chills, N/V/D.  Painful.  Foul smelling drainage.  In ED was started on IV clinda.  CT demonstrated +large dental caries, no abscess nor airway effacement.  Pt was admitted.        Principal Problem:   Dental infection Active Problems:   Polysubstance (including opioids) dependence with physiological dependence (HCC)   Impression/Plan:  1)  Dental infection - swelling improving, cont IV clindamycin, pain meds, IVF's, soft diet. Possible dc tomorrow if continues to improve.    2)  Opiate abuse - blood cx's pending, ECHO is normal   DVT prophylaxis: Lovenox Code Status: Full Family Communication: No family in room Disposition Plan: Home after admit Consults called: None Admission status: Place in obs    Treatments:              -IV clindamycin 2/10 >   Procedures: -none  Consults: -none    Vinson Moselleob Meloney Feld MD [facility] pgr [phone] 02/13/2017, 2:43 PM   Recent Labs  Lab 02/12/17 1629 02/13/17 0652  NA 135 138  K 3.5 3.6  CL 101 105  CO2 28 23  GLUCOSE 97 110*  BUN 11 13  CREATININE 0.70 0.62  CALCIUM 8.7* 8.8*   Recent Labs  Lab 02/12/17 1629  AST 23  ALT 18  ALKPHOS 87  BILITOT 0.4  PROT 7.7  ALBUMIN 3.8   Recent Labs  Lab 02/12/17 1629 02/13/17 0652  WBC 12.4* 9.7  NEUTROABS 8.6*  --   HGB 11.8* 10.9*  HCT 36.3 33.4*  MCV 78.7 78.6  PLT 250 213   Iron/TIBC/Ferritin/ %Sat No results found for: IRON, TIBC, FERRITIN, IRONPCTSAT

## 2017-02-13 NOTE — Progress Notes (Signed)
  Echocardiogram 2D Echocardiogram has been performed.  Eileen Torres T Eileen Torres 02/13/2017, 8:58 AM

## 2017-02-14 MED ORDER — LORAZEPAM 1 MG PO TABS
1.0000 mg | ORAL_TABLET | Freq: Once | ORAL | Status: AC
  Administered 2017-02-14: 1 mg via ORAL
  Filled 2017-02-14: qty 1

## 2017-02-14 NOTE — Progress Notes (Signed)
Patient requested to go smoke this morning at 0715. RN attempted to page the doctor, and ask that the patient to stay until the MD could be reached. At [phone] patient stated "im going to go smoke, so I guess give [facility] the [facility]". Patient left the floor, while RN attempted to reach the doctor.  Patient returned to the floor at 0745.   Patient then called me to her room again, and asked if she could leave "because yall aren't getting me what I need; You cant even get IV access on me" She then requested the "paperwork" again.   [facility] at [phone], and left the floor at 0827. MD notified at this time that the patient left AMA.

## 2017-02-14 NOTE — Discharge Summary (Signed)
Physician Discharge Summary  Patient ID: Eileen Torres MRN: [id number] DOB/AGE: 34/03/1985 34 y.o.  Admit date: 02/12/2017 Discharge date: 02/14/2017  Admission Diagnoses:  Discharge Diagnoses:  Principal Problem:   Dental infection Active Problems:   Polysubstance (including opioids) dependence with physiological dependence [facility](HCC)   Discharged Condition: fair  Hospital Course: 32 y.o.femalewith medical history significant ofIVDU, heroin abuse. Patient presented to the ED with R faical swelling, onset 24 hr, getting worse.  No fever, chills, N/V/D. Painful. Foul smelling drainage.  In ED was started on IV clinda. CT demonstrated +large dental caries, no abscess nor airway effacement. Pt was admitted.  CT scan showed dental caries w/ surrounding extensive inflammatory changes in the R maxillary area, no abscess.  received 3 doses IV clindamycin total.  On 2/11 the facial swelling was down about 50% and pain was improving, pt was able to swallow and eat.  IV access couldn't be maintained, IV team saw her and they were unable to get a midline in.  We changed her over to po augmentin, she got one dose and then left AMA in the am on 2/12.      Consults: None  Significant Diagnostic Studies: radiology: CT scan:  IMPRESSION: 1. Extensive inflammatory changes about the right side of the face appear to be centered at the right maxilla where there is a large dental caries of the second right maxillary molar and periapical lucency with extension through the cortex laterally. 2. No discrete abscess. 3. Reactive right level 1 and level 2 adenopathy. 4. Prominent palatine tonsils bilaterally may be associated with acute pharyngitis. There is no discrete abscess.  Treatments: IV clindamycin  Discharge Exam: Blood pressure 102/69, pulse (!) 59, temperature 98.2 F (36.8 C), temperature source Oral, resp. rate 15, height 5\' 3"  (1.6 m), weight 54.4 kg (120 lb), SpO2 100 %. Alert, no  distress No jvd Facial swelling on R Throat clear Chest cta bilat RRR no mrg Abd soft ntnd  Ext no edema   Disposition: 07-Left Against Medical Advice/Left Without Being Seen/Elopement   Allergies as of 02/14/2017   No Known Allergies     Medication List    STOP taking these medications   pantoprazole 40 MG tablet Commonly known as:  PROTONIX   prochlorperazine 5 MG tablet Commonly known as:  COMPAZINE   thiamine 100 MG tablet Commonly known as:  VITAMIN B-1     TAKE these medications   acetaminophen 325 MG tablet Commonly known as:  TYLENOL Take 650 mg by mouth 3 (three) times daily as needed for moderate pain.   cloNIDine 0.1 mg/24hr patch Commonly known as:  CATAPRES - Dosed in mg/24 hr Place 0.1 mg onto the skin once a week.        Signed: Maree KrabbeRobert D Onnie Hatchel 02/14/2017, 9:19 AM

## 2017-02-15 ENCOUNTER — Ambulatory Visit (HOSPITAL_COMMUNITY)
Admission: EM | Admit: 2017-02-15 | Discharge: 2017-02-15 | Disposition: A | Discharge status: Home / Self Care | Payer: Medicaid Other | Attending: Family Medicine | Admitting: Family Medicine

## 2017-02-15 ENCOUNTER — Encounter (HOSPITAL_COMMUNITY): Payer: Self-pay | Admitting: Family Medicine

## 2017-02-15 ENCOUNTER — Other Ambulatory Visit: Payer: Self-pay

## 2017-02-15 DIAGNOSIS — L0291 Cutaneous abscess, unspecified: Secondary | ICD-10-CM

## 2017-02-15 MED ORDER — DOXYCYCLINE HYCLATE 100 MG PO TABS: 100.0000 mg | ORAL_TABLET | Freq: Two times a day (BID) | ORAL | 0 refills | Status: DC

## 2017-02-15 NOTE — ED Provider Notes (Signed)
[facility] CARE CENTER   [id number] 02/15/17 Arrival Time: 0959   SUBJECTIVE:  Eileen Torres is a 32 y.o. female who presents to the urgent care with complaint of right facial swelling x 3 days which drained large amount of pus last night in the shower and the swelling is considerably less. Tried going to ED but wait was over 5 hours.    Past Medical History:  Diagnosis Date  . AKI (acute kidney injury) (HCC) 06/2016  . Anxiety   . Bipolar disorder (HCC)   . Childhood asthma   . Depression   . Encounter for Essure implantation 09/2011  . GERD (gastroesophageal reflux disease)   . Heroin abuse (HCC)   . HSV (herpes simplex virus) infection 2009   ?outbreak could be HSV last pregnancy-not definate diagnosis per pt and chart- pt on Valtrex prophylacttcally  . Migraines    08/19/2015 "4-6 good ones/year"  . Polysubstance abuse (HCC)   . Prolonged QT interval   . Pyelonephritis    Family History  Problem Relation Age of Onset  . Cancer Other   . Diabetes Other   . Heart failure Other   . Stroke Other    Social History   Socioeconomic History  . Marital status: Legally Separated    Spouse name: Not on file  . Number of children: Not on file  . Years of education: Not on file  . Highest education level: Not on file  Social Needs  . Financial resource strain: Not on file  . Food insecurity - worry: Not on file  . Food insecurity - inability: Not on file  . Transportation needs - medical: Not on file  . Transportation needs - non-medical: Not on file  Occupational History  . Not on file  Tobacco Use  . Smoking status: Current Every Day Smoker    Packs/day: 0.25    Years: 19.00    Pack years: 4.75    Types: Cigarettes  . Smokeless tobacco: Never Used  Substance and Sexual Activity  . Alcohol use: Yes  . Drug use: Yes    Comment: roxicodone, heroin, cocaine  . Sexual activity: Yes    Birth control/protection: Other-see comments    Comment: Essure in office  postpartum  Other Topics Concern  . Not on file  Social History Narrative  . Not on file   No outpatient medications have been marked as taking for the 02/15/17 encounter [facility] Encounter).   No Known Allergies    ROS: As per HPI, remainder of ROS negative.   OBJECTIVE:   Vitals:   02/15/17 1018  BP: (!) 91/57  Pulse: 79  Resp: 16  Temp: 97.9 F (36.6 C)  TempSrc: Oral  SpO2: 99%     General appearance: alert; no distress Eyes: PERRL; EOMI; conjunctiva normal HENT: right facial swelling with open sore at right corner of mouth.  Minimally tender and not fluctuant Neck: supple Back: no CVA tenderness Extremities: no cyanosis or edema; symmetrical with no gross deformities Skin: warm and dry with eschar at corner of right mouth Neurologic: normal gait; grossly normal Psychological: alert and cooperative; normal mood and affect      Labs:  Results for orders placed or performed during the hospital encounter of 02/12/17  Blood culture (routine x 2)  Result Value Ref Range   Specimen Description      BLOOD RIGHT FOREARM Performed at [facility], [address] [place], [place] [address]    Special Requests  BOTTLES DRAWN AEROBIC AND ANAEROBIC Blood Culture adequate volume Performed at [facility], [address] [place], [place] [address]    Culture      NO GROWTH 1 DAY Performed at [facility], [address] [place], [place] [address]    Report Status PENDING   Culture, blood (single)  Result Value Ref Range   Specimen Description      BLOOD RIGHT ANTECUBITAL Performed at [facility], [address] [place], [place] [address]    Special Requests      BOTTLES DRAWN AEROBIC AND ANAEROBIC Blood Culture adequate volume Performed at [facility], [address] [place], [place] [address]    Culture      NO GROWTH < 24 HOURS Performed at [facility], [address] [place], [place] [address]    Report Status PENDING   CBC with Differential  Result Value Ref Range   WBC 12.4 (H) 4.0 - 10.5 K/uL   RBC 4.61 3.87 - 5.11 MIL/uL   Hemoglobin 11.8 (L) 12.0 - 15.0 g/dL   HCT 13.036.3 86.536.0 - 78.446.0 %   MCV 78.7 78.0 - 100.0 fL   MCH 25.6 (L) 26.0 - 34.0 pg   MCHC 32.5 30.0 - 36.0 g/dL   RDW 69.616.1 (H) 29.511.5 - 28.415.5 %   Platelets 250 150 - 400 K/uL   Neutrophils Relative % 69 %   Neutro Abs 8.6 (H) 1.7 - 7.7 K/uL   Lymphocytes Relative 22 %   Lymphs Abs 2.7 0.7 - 4.0 K/uL   Monocytes Relative 8 %   Monocytes Absolute 1.0 0.1 - 1.0 K/uL   Eosinophils Relative 1 %   Eosinophils Absolute 0.1 0.0 - 0.7 K/uL   Basophils Relative 0 %   Basophils Absolute 0.0 0.0 - 0.1 K/uL  Comprehensive metabolic panel  Result Value Ref Range   Sodium 135 135 - 145 mmol/L   Potassium 3.5 3.5 - 5.1 mmol/L   Chloride 101 101 - 111 mmol/L   CO2 28 22 - 32 mmol/L   Glucose, Bld 97 65 - 99 mg/dL   BUN 11 6 - 20 mg/dL   Creatinine, Ser 1.320.70 0.44 - 1.00 mg/dL   Calcium 8.7 (L) 8.9 - 10.3 mg/dL   Total Protein 7.7 6.5 - 8.1 g/dL   Albumin 3.8 3.5 - 5.0 g/dL   AST 23 15 - 41 U/L   ALT 18 14 - 54 U/L   Alkaline Phosphatase 87 38 - 126 U/L   Total Bilirubin 0.4 0.3 - 1.2 mg/dL   GFR calc non Af Amer >60 >60 mL/min   GFR calc Af Amer >60 >60 mL/min   Anion gap 6 5 - 15  Urine rapid drug screen (hosp performed)  Result Value Ref Range   Opiates POSITIVE (A) NONE DETECTED   Cocaine NONE DETECTED NONE DETECTED   Benzodiazepines NONE DETECTED NONE DETECTED   Amphetamines NONE DETECTED NONE DETECTED   Tetrahydrocannabinol POSITIVE (A) NONE DETECTED   Barbiturates NONE DETECTED NONE DETECTED  Urinalysis, Routine w reflex microscopic  Result Value Ref Range   Color, Urine STRAW (A) YELLOW   APPearance CLEAR CLEAR   Specific Gravity, Urine 1.005 1.005 - 1.030   pH 8.0 5.0 - 8.0   Glucose, UA NEGATIVE NEGATIVE mg/dL   Hgb urine dipstick SMALL (A) NEGATIVE    Bilirubin Urine NEGATIVE NEGATIVE   Ketones, ur NEGATIVE NEGATIVE mg/dL   Protein, ur NEGATIVE NEGATIVE  mg/dL   Nitrite NEGATIVE NEGATIVE   Leukocytes, UA NEGATIVE NEGATIVE   RBC / HPF 0-5 0 - 5 RBC/hpf   WBC, UA 0-5 0 - 5 WBC/hpf   Bacteria, UA RARE (A) NONE SEEN   Squamous Epithelial / LPF 0-5 (A) NONE SEEN   Mucus PRESENT   CBC  Result Value Ref Range   WBC 9.7 4.0 - 10.5 K/uL   RBC 4.25 3.87 - 5.11 MIL/uL   Hemoglobin 10.9 (L) 12.0 - 15.0 g/dL   HCT 16.1 (L) 09.6 - 04.5 %   MCV 78.6 78.0 - 100.0 fL   MCH 25.6 (L) 26.0 - 34.0 pg   MCHC 32.6 30.0 - 36.0 g/dL   RDW 40.9 (H) 81.1 - 91.4 %   Platelets 213 150 - 400 K/uL  Basic metabolic panel  Result Value Ref Range   Sodium 138 135 - 145 mmol/L   Potassium 3.6 3.5 - 5.1 mmol/L   Chloride 105 101 - 111 mmol/L   CO2 23 22 - 32 mmol/L   Glucose, Bld 110 (H) 65 - 99 mg/dL   BUN 13 6 - 20 mg/dL   Creatinine, Ser 7.82 0.44 - 1.00 mg/dL   Calcium 8.8 (L) 8.9 - 10.3 mg/dL   GFR calc non Af Amer >60 >60 mL/min   GFR calc Af Amer >60 >60 mL/min   Anion gap 10 5 - 15  I-Stat CG4 Lactic Acid, ED  Result Value Ref Range   Lactic Acid, Venous 1.41 0.5 - 1.9 mmol/L  I-Stat beta hCG blood, ED  Result Value Ref Range   I-stat hCG, quantitative <5.0 <5 mIU/mL   Comment 3          ECHOCARDIOGRAM COMPLETE  Result Value Ref Range   Weight 1,920 oz   Height 63 in   BP 112/65 mmHg    Labs Reviewed - No data to display  No results found.     ASSESSMENT & PLAN:  1. Abscess     Meds ordered this encounter  Medications  . doxycycline (VIBRA-TABS) 100 MG tablet    Sig: Take 1 tablet (100 mg total) by mouth 2 (two) times daily.    Dispense:  20 tablet    Refill:  0    Reviewed expectations re: course of current medical issues. Questions answered. Outlined signs and symptoms indicating need for more acute intervention. Patient verbalized understanding. After Visit Summary given.    Procedures:      Elvina Sidle,  MD 02/15/17 1027

## 2017-02-15 NOTE — ED Notes (Signed)
PT TRIAGED BY PROVIDER.

## 2017-02-18 LAB — CULTURE, BLOOD (ROUTINE X 2)
CULTURE: NO GROWTH
Special Requests: ADEQUATE

## 2017-02-18 LAB — CULTURE, BLOOD (SINGLE)
CULTURE: NO GROWTH
SPECIAL REQUESTS: ADEQUATE

## 2017-07-08 ENCOUNTER — Emergency Department (HOSPITAL_COMMUNITY)
Admission: EM | Admit: 2017-07-08 | Discharge: 2017-07-08 | Disposition: A | Discharge status: Home / Self Care | Payer: Self-pay | Attending: Emergency Medicine | Admitting: Emergency Medicine

## 2017-07-08 ENCOUNTER — Other Ambulatory Visit: Payer: Self-pay

## 2017-07-08 DIAGNOSIS — F1110 Opioid abuse, uncomplicated: Secondary | ICD-10-CM | POA: Insufficient documentation

## 2017-07-08 DIAGNOSIS — T401X1A Poisoning by heroin, accidental (unintentional), initial encounter: Secondary | ICD-10-CM | POA: Insufficient documentation

## 2017-07-08 DIAGNOSIS — Z79899 Other long term (current) drug therapy: Secondary | ICD-10-CM | POA: Insufficient documentation

## 2017-07-08 DIAGNOSIS — F17210 Nicotine dependence, cigarettes, uncomplicated: Secondary | ICD-10-CM | POA: Insufficient documentation

## 2017-07-08 MED ORDER — NALOXONE HCL 4 MG/0.1ML NA LIQD
1.0000 | Freq: Once | NASAL | Status: AC
  Administered 2017-07-08: 1 via NASAL
  Filled 2017-07-08: qty 4

## 2017-07-08 NOTE — ED Notes (Signed)
Pt resting, easy to arouse- resp unlabored

## 2017-07-08 NOTE — ED Notes (Signed)
Got patient undress on the monitor patient is resting with nurse at bedside 

## 2017-07-08 NOTE — ED Notes (Signed)
Pt ambulatory to bathroom, awake, alert. Instructed on Narcan

## 2017-07-08 NOTE — ED Triage Notes (Signed)
Pt to ED via [facility]--friend called 911- pt in car, overdosed on heroin with another person. Pt on arrival is awake, arousable, oriented x 4,  Received Narcan 2mg  intranasal per EMS- per EMS pt had agonal resp on their arrival to scene.

## 2017-07-08 NOTE — ED Provider Notes (Signed)
[facility] EMERGENCY DEPARTMENT Provider Note   CSN: [id number] Arrival date & time: 07/08/17  1039     History   Chief Complaint Chief Complaint  Patient presents with  . Drug Overdose    HPI Eileen Torres is a 32 y.o. female.  HPI Patient presents via EMS after presumed overdose. Patient herself is awake and alert, states that she used heroin earlier this morning, but is now feeling better after a period of not recalling what occurred. She notes that she relapsed recently, has been using heroin for about 4 years. She states that she is otherwise well, denies other medical problems. She denies other coingestants, or alcohol. EMS notes that on their arrival there were 2 individuals each of them was unresponsive, in this patient was apneic. Patient received 2 mg intranasal Narcan, and has awakened substantially.  She notes that she injected into her right foot.  Obtained via patient and EMS.  Past Medical History:  Diagnosis Date  . AKI (acute kidney injury) (HCC) 06/2016  . Anxiety   . Bipolar disorder (HCC)   . Childhood asthma   . Depression   . Encounter for Essure implantation 09/2011  . GERD (gastroesophageal reflux disease)   . Heroin abuse (HCC)   . HSV (herpes simplex virus) infection 2009   ?outbreak could be HSV last pregnancy-not definate diagnosis per pt and chart- pt on Valtrex prophylacttcally  . Migraines    08/19/2015 "4-6 good ones/year"  . Polysubstance abuse (HCC)   . Prolonged QT interval   . Pyelonephritis     Patient Active Problem List   Diagnosis Date Noted  . Dental infection 02/12/2017  . Acute renal failure (HCC) 06/06/2016  . Alcohol abuse   . Dehydration   . Heroin withdrawal (HCC)   . Polysubstance (including opioids) dependence with physiological dependence (HCC)   . AKI (acute kidney injury) (HCC)   . Cocaine abuse (HCC) 08/16/2015  . Pyelonephritis   . Sepsis (HCC)   . Heroin user 01/25/2015     Past Surgical History:  Procedure Laterality Date  . DILITATION & CURRETTAGE/HYSTROSCOPY WITH ESSURE  2013     OB History    Gravida  2   Para  2   Term  1   Preterm  1   AB      Living  2     SAB      TAB      Ectopic      Multiple      Live Births  1            Home Medications    Prior to Admission medications   Medication Sig Start Date End Date Taking? Authorizing Provider  acetaminophen (TYLENOL) 325 MG tablet Take 650 mg by mouth 3 (three) times daily as needed for moderate pain.    [provider]  amantadine (SYMMETREL) 100 MG capsule Take 100 mg by mouth 3 (three) times daily. 03/15/17   [provider]  carbamazepine (TEGRETOL) 200 MG tablet Take 200 mg by mouth 3 (three) times daily. 04/22/16   [provider]  cloNIDine (CATAPRES - DOSED IN MG/24 HR) 0.1 mg/24hr patch Place 0.1 mg onto the skin once a week.    [provider]  gabapentin (NEURONTIN) 400 MG capsule Take 400 mg by mouth 2 (two) times daily. 04/10/17   [provider]    Family History Family History  Problem Relation Age of Onset  . Cancer Other   .  Diabetes Other   . Heart failure Other   . Stroke Other     Social History Social History   Tobacco Use  . Smoking status: Current Every Day Smoker    Packs/day: 0.25    Years: 19.00    Pack years: 4.75    Types: Cigarettes  . Smokeless tobacco: Never Used  Substance Use Topics  . Alcohol use: Yes  . Drug use: Yes    Comment: roxicodone, heroin, cocaine     Allergies   Patient has no known allergies.   Review of Systems Review of Systems  Constitutional:       Per HPI, otherwise negative  HENT:       Per HPI, otherwise negative  Respiratory:       Per HPI, otherwise negative  Cardiovascular:       Per HPI, otherwise negative  Gastrointestinal: Negative for vomiting.  Endocrine:       Negative aside from HPI  Genitourinary:       Neg aside from HPI    Musculoskeletal:       Per HPI, otherwise negative  Skin: Positive for wound.  Allergic/Immunologic: Negative for immunocompromised state.  Neurological: Negative for syncope.  Psychiatric/Behavioral:       Substance abuse     Physical Exam Updated Vital Signs BP (!) 102/92   Pulse 89   Temp 98.4 F (36.9 C) (Oral)   Resp 16   SpO2 95%   Physical Exam  Constitutional: She is oriented to person, place, and time. She appears well-developed and well-nourished.  Thin young female arriving via EMS drowsy but interactive  HENT:  Head: Normocephalic and atraumatic.  Eyes: Conjunctivae and EOM are normal.  Cardiovascular: Normal rate and regular rhythm.  Pulmonary/Chest: Effort normal and breath sounds normal. No stridor. No respiratory distress.  Abdominal: She exhibits no distension.  Musculoskeletal: She exhibits no edema.  Neurological: She is alert and oriented to person, place, and time. No cranial nerve deficit.  Skin: Skin is warm and dry.     Psychiatric: She has a normal mood and affect.  Nursing note and vitals reviewed.    ED Treatments / Results   Procedures Procedures (including critical care time)  Medications Ordered in ED Medications  naloxone (NARCAN) nasal spray 4 mg/0.1 mL (has no administration in time range)     Initial Impression / Assessment and Plan / ED Course  I have reviewed the triage vital signs and the nursing notes.  Pertinent labs & imaging results that were available during my care of the patient were reviewed by me and considered in my medical decision making (see chart for details).    Immediately after the initial evaluation the patient was placed on continuous monitoring.  12:39 PM Patient awake and alert, hemodynamically unremarkable, now about 90 minutes since most recent Narcan, with no evidence for return of apnea, unresponsiveness, we discussed the importance of following up with a rehabilitation center, patient was  provided Narcan on discharge, per her request, and she was discharged in stable condition.  Final Clinical Impressions(s) / ED Diagnoses   Final diagnoses:  Accidental overdose of heroin, initial encounter [facility])     Gerhard Munch, MD 07/08/17 1239

## 2017-07-31 ENCOUNTER — Emergency Department (HOSPITAL_COMMUNITY)
Admission: EM | Admit: 2017-07-31 | Discharge: 2017-07-31 | Disposition: A | Discharge status: Home / Self Care | Payer: Self-pay | Attending: Emergency Medicine | Admitting: Emergency Medicine

## 2017-07-31 ENCOUNTER — Encounter (HOSPITAL_COMMUNITY): Payer: Self-pay | Admitting: Emergency Medicine

## 2017-07-31 ENCOUNTER — Other Ambulatory Visit: Payer: Self-pay

## 2017-07-31 DIAGNOSIS — Z79899 Other long term (current) drug therapy: Secondary | ICD-10-CM | POA: Insufficient documentation

## 2017-07-31 DIAGNOSIS — N390 Urinary tract infection, site not specified: Secondary | ICD-10-CM | POA: Insufficient documentation

## 2017-07-31 DIAGNOSIS — J45909 Unspecified asthma, uncomplicated: Secondary | ICD-10-CM | POA: Insufficient documentation

## 2017-07-31 DIAGNOSIS — R319 Hematuria, unspecified: Secondary | ICD-10-CM | POA: Insufficient documentation

## 2017-07-31 DIAGNOSIS — F17210 Nicotine dependence, cigarettes, uncomplicated: Secondary | ICD-10-CM | POA: Insufficient documentation

## 2017-07-31 LAB — URINALYSIS, ROUTINE W REFLEX MICROSCOPIC
Bilirubin Urine: NEGATIVE
Glucose, UA: NEGATIVE mg/dL
KETONES UR: NEGATIVE mg/dL
Nitrite: NEGATIVE
PH: 6 (ref 5.0–8.0)
PROTEIN: 30 mg/dL — AB
SPECIFIC GRAVITY, URINE: 1.016 (ref 1.005–1.030)
WBC, UA: >50 WBC/hpf — ABNORMAL HIGH (ref 0–5)

## 2017-07-31 LAB — POC URINE PREG, ED: Preg Test, Ur: NEGATIVE

## 2017-07-31 MED ORDER — ONDANSETRON HCL 4 MG PO TABS: 4.0000 mg | ORAL_TABLET | Freq: Four times a day (QID) | ORAL | 0 refills | Status: DC

## 2017-07-31 MED ORDER — SULFAMETHOXAZOLE-TRIMETHOPRIM 800-160 MG PO TABS: 1.0000 | ORAL_TABLET | Freq: Two times a day (BID) | ORAL | 0 refills | Status: DC

## 2017-07-31 MED ORDER — LIDOCAINE HCL (PF) 1 % IJ SOLN
INTRAMUSCULAR | Status: AC
  Filled 2017-07-31: qty 5

## 2017-07-31 MED ORDER — ONDANSETRON HCL 4 MG PO TABS: 4.0000 mg | ORAL_TABLET | Freq: Four times a day (QID) | ORAL | 0 refills | Status: AC

## 2017-07-31 MED ORDER — CEFTRIAXONE SODIUM 1 G IJ SOLR
1.0000 g | Freq: Once | INTRAMUSCULAR | Status: AC
  Administered 2017-07-31: 1 g via INTRAMUSCULAR
  Filled 2017-07-31: qty 10

## 2017-07-31 MED ORDER — SULFAMETHOXAZOLE-TRIMETHOPRIM 800-160 MG PO TABS: 1.0000 | ORAL_TABLET | Freq: Two times a day (BID) | ORAL | 0 refills | Status: AC

## 2017-07-31 NOTE — ED Provider Notes (Signed)
[facility] EMERGENCY DEPARTMENT Provider Note   CSN: [id number] Arrival date & time: 07/31/17  1153    History   Chief Complaint Chief Complaint  Patient presents with  . Back Pain    HPI Eileen Torres is a 32 y.o. female.  HPI   32 year old female presents today with complaints of dysuria. Patient reports that over the last 3 days she's had burning with urination or urinary urine and bilateral lower back pain. Patient notes fever at home, ibuprofen and 11 today. She notes some nausea and one episode of vomiting today. She notes she has been tolerating by mouth since then. She denies any recent infectious etiology, denies any recent urinary tract infection or recent antibiotics. Patient reports she is a heroin user daily most recently used heroin approximately 3 AM last night. She reports that she will not be staying and is requesting treatment and immediate discharge. She reports the back pain she was experiencing has gone away.She denies any other infectious etiology.    Past Medical History:  Diagnosis Date  . AKI (acute kidney injury) (HCC) 06/2016  . Anxiety   . Bipolar disorder (HCC)   . Childhood asthma   . Depression   . Encounter for Essure implantation 09/2011  . GERD (gastroesophageal reflux disease)   . Heroin abuse (HCC)   . HSV (herpes simplex virus) infection 2009   ?outbreak could be HSV last pregnancy-not definate diagnosis per pt and chart- pt on Valtrex prophylacttcally  . Migraines    08/19/2015 "4-6 good ones/year"  . Polysubstance abuse (HCC)   . Prolonged QT interval   . Pyelonephritis     Patient Active Problem List   Diagnosis Date Noted  . Dental infection 02/12/2017  . Acute renal failure (HCC) 06/06/2016  . Alcohol abuse   . Dehydration   . Heroin withdrawal (HCC)   . Polysubstance (including opioids) dependence with physiological dependence (HCC)   . AKI (acute kidney injury) (HCC)   . Cocaine abuse (HCC) 08/16/2015   . Pyelonephritis   . Sepsis (HCC)   . 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 DILITATION & CURRETTAGE/HYSTROSCOPY WITH ESSURE  2013     OB History    Gravida  2   Para  2   Term  1   Preterm  1   AB      Living  2     SAB      TAB      Ectopic      Multiple      Live Births  1            Home Medications    Prior to Admission medications   Medication Sig Start Date End Date Taking? Authorizing Provider  acetaminophen (TYLENOL) 325 MG tablet Take 650 mg by mouth 3 (three) times daily as needed for moderate pain.    [provider]  amantadine (SYMMETREL) 100 MG capsule Take 100 mg by mouth 3 (three) times daily. 03/15/17   [provider]  carbamazepine (TEGRETOL) 200 MG tablet Take 200 mg by mouth 3 (three) times daily. 04/22/16   [provider]  cloNIDine (CATAPRES - DOSED IN MG/24 HR) 0.1 mg/24hr patch Place 0.1 mg onto the skin once a week.    [provider]  gabapentin (NEURONTIN) 400 MG capsule Take 400 mg by mouth 2 (two) times daily. 04/10/17   [provider]  ondansetron (ZOFRAN) 4  MG tablet Take 1 tablet (4 mg total) by mouth every 6 (six) hours. 07/31/17   Jhoselyn Ruffini, Tinnie GensJeffrey, PA-C  sulfamethoxazole-trimethoprim (BACTRIM DS,SEPTRA DS) 800-160 MG tablet Take 1 tablet by mouth 2 (two) times daily for 10 days. 07/31/17 08/10/17  Eyvonne MechanicHedges, Daune Divirgilio, PA-C    Family History Family History  Problem Relation Age of Onset  . Cancer Other   . Diabetes Other   . Heart failure Other   . Stroke Other     Social History Social History   Tobacco Use  . Smoking status: Current Every Day Smoker    Packs/day: 0.25    Years: 19.00    Pack years: 4.75    Types: Cigarettes  . Smokeless tobacco: Never Used  Substance Use Topics  . Alcohol use: Yes  . Drug use: Yes    Comment: roxicodone, heroin, cocaine     Allergies   Patient has no known allergies.   Review of  Systems Review of Systems  All other systems reviewed and are negative.  Physical Exam Updated Vital Signs BP 108/73 (BP Location: Right Arm)   Pulse 95   Temp 97.9 F (36.6 C) (Oral)   Resp 16   SpO2 100%   Physical Exam  Constitutional: She is oriented to person, place, and time. She appears well-developed and well-nourished.  HENT:  Head: Normocephalic and atraumatic.  Eyes: Pupils are equal, round, and reactive to light. Conjunctivae are normal. Right eye exhibits no discharge. Left eye exhibits no discharge. No scleral icterus.  Neck: Normal range of motion. No JVD present. No tracheal deviation present.  Pulmonary/Chest: Effort normal. No stridor.  Abdominal: Soft. She exhibits no distension and no mass. There is no tenderness. There is no rebound and no guarding. No hernia.  No CVAT  Musculoskeletal:  No CT or L-spine tenderness palpation, back atraumatic no swelling or edema, no redness warmth to touch  Neurological: She is alert and oriented to person, place, and time. Coordination normal.  Psychiatric: She has a normal mood and affect. Her behavior is normal. Judgment and thought content normal.  Nursing note and vitals reviewed.  ED Treatments / Results  Labs (all labs ordered are listed, but only abnormal results are displayed) Labs Reviewed  URINALYSIS, ROUTINE W REFLEX MICROSCOPIC - Abnormal; Notable for the following components:      Result Value   APPearance HAZY (*)    Hgb urine dipstick MODERATE (*)    Protein, ur 30 (*)    Leukocytes, UA MODERATE (*)    WBC, UA >50 (*)    Bacteria, UA MANY (*)    All other components within normal limits  URINE CULTURE  POC URINE PREG, ED    EKG None  Radiology No results found.  Procedures Procedures (including critical care time)  Medications Ordered in ED Medications  lidocaine (PF) (XYLOCAINE) 1 % injection (has no administration in time range)  cefTRIAXone (ROCEPHIN) injection 1 g (1 g Intramuscular  Given 07/31/17 1416)     Initial Impression / Assessment and Plan / ED Course  I have reviewed the triage vital signs and the nursing notes.  Pertinent labs & imaging results that were available during my care of the patient were reviewed by me and considered in my medical decision making (see chart for details).     Labs: POC urine pregnancy, urinalysis  Imaging:  Consults:  Therapeutics: ceftriaxone  Discharge Meds:   Assessment/Plan: 32 year old female presents today with likely urinary tract infection. She is afebrile well-appearing  in no acute distress. She is tolerating by mouth no signs of systemic illness. High suspicion for urinary tract, lower system for any other acute infectious etiology. Patient is an IV drug user, low suspicion for blood-borne infection, epidural abscess, osteomyelitis or any other concerning features. Patient is not having back pain at the time of my evaluation. Patient is adamant that she will not stay for in-depth evaluation, she is requesting medications immediate discharge. I do feel her workup is complete, she will return if symptoms worsen or do not improve in 48 hours. Patient verbalized understanding and agreement today's plan had no further questions or concerns.      Final Clinical Impressions(s) / ED Diagnoses   Final diagnoses:  Urinary tract infection with hematuria, site unspecified    ED Discharge Orders        Ordered    sulfamethoxazole-trimethoprim (BACTRIM DS,SEPTRA DS) 800-160 MG tablet  2 times daily,   Status:  Discontinued     07/31/17 1358    ondansetron (ZOFRAN) 4 MG tablet  Every 6 hours,   Status:  Discontinued     07/31/17 1358    sulfamethoxazole-trimethoprim (BACTRIM DS,SEPTRA DS) 800-160 MG tablet  2 times daily     07/31/17 1359    ondansetron (ZOFRAN) 4 MG tablet  Every 6 hours     07/31/17 1400       Eyvonne Mechanic, PA-C 07/31/17 1444    Sabas Sous, MD 07/31/17 2355

## 2017-07-31 NOTE — Discharge Instructions (Addendum)
Please read attached information. If you experience any new or worsening signs or symptoms please return to the emergency room for evaluation. Please follow-up with your primary care provider or specialist as discussed. Please use medication prescribed only as directed and discontinue taking if you have any concerning signs or symptoms.   °

## 2017-07-31 NOTE — ED Notes (Signed)
Pt verbalized understanding of discharge instructions and denies any further questions at this time.   

## 2017-07-31 NOTE — ED Triage Notes (Signed)
Patient complains of bilateral lower back pain and foul smelling urine x3 days, states she thinks she is developing a kidney infection. Afebrile in triage. Patient alert, oriented, and in no apparent distress at this time.

## 2017-08-02 LAB — URINE CULTURE
Culture: >=100000 — AB
SPECIAL REQUESTS: NORMAL

## 2017-08-03 ENCOUNTER — Telehealth: Payer: Self-pay | Admitting: *Deleted

## 2017-08-03 NOTE — Telephone Encounter (Signed)
Post ED Visit - Positive Culture Follow-up: Successful Patient Follow-Up  Culture assessed and recommendations reviewed by:  []  Enzo BiNathan Batchelder, Pharm.D. []  Celedonio MiyamotoJeremy Frens, Pharm.D., BCPS AQ-ID []  Garvin FilaMike Maccia, Pharm.D., BCPS []  Georgina PillionElizabeth Martin, Pharm.D., BCPS []  Messiah CollegeMinh Pham, [address] Rainbow BoulevardPharm.D., BCPS, AAHIVP []  Estella HuskMichelle Turner, Pharm.D., BCPS, AAHIVP []  Lysle Pearlachel Rumbarger, PharmD, BCPS []  Phillips Climeshuy Dang, PharmD, BCPS []  Agapito GamesAlison Masters, PharmD, BCPS [x]  Verlan FriendsErin Deja, PharmD  Positive urine culture  []  Patient discharged without antimicrobial prescription and treatment is now indicated [x]  Organism is resistant to prescribed ED discharge antimicrobial []  Patient with positive blood cultures  Changes discussed with ED provider: Army MeliaLaura Murphy, PA New antibiotic prescription Amoxicillin 500mg  Q8hrs x 10 days [facility] [address] [phone]  Contacted patient, date 08/03/2017, time 1145   [name], Eileen Torres 08/03/2017, 11:42 AM

## 2017-08-03 NOTE — Progress Notes (Signed)
ED Antimicrobial Stewardship Positive Culture Follow Up   Eileen BeltCourtney L Torres is an 32 y.o. female who [facility] with a chief complaint of lower back pain, dysuria, fever, and NV.  Chief Complaint  Patient presents with  . Back Pain   Recent Results (from the past 720 hour(s))  Urine C&S     Status: Abnormal   Collection Time: 07/31/17 12:08 PM  Result Value Ref Range Status   Specimen Description URINE, CLEAN CATCH  Final   Special Requests   Final    Normal Performed at [facility], [address] [place], [place] [address]    Culture >=100,000 COLONIES/mL ESCHERICHIA COLI (A)  Final   Report Status 08/02/2017 FINAL  Final   Organism ID, Bacteria ESCHERICHIA COLI (A)  Final      Susceptibility   Escherichia coli - MIC*    AMPICILLIN <=2 SENSITIVE Sensitive     CEFAZOLIN <=4 SENSITIVE Sensitive     CEFTRIAXONE <=1 SENSITIVE Sensitive     CIPROFLOXACIN <=0.25 SENSITIVE Sensitive     GENTAMICIN <=1 SENSITIVE Sensitive     IMIPENEM <=0.25 SENSITIVE Sensitive     NITROFURANTOIN <=16 SENSITIVE Sensitive     TRIMETH/SULFA >=320 RESISTANT Resistant     AMPICILLIN/SULBACTAM <=2 SENSITIVE Sensitive     PIP/TAZO <=4 SENSITIVE Sensitive     Extended ESBL NEGATIVE Sensitive     * >=100,000 COLONIES/mL ESCHERICHIA COLI    [x]  Treated with Bactrim, organism resistant to prescribed antimicrobial []  Patient discharged originally without antimicrobial agent and treatment is now indicated  New antibiotic prescription: Amoxicillin 500mg  PO q8h x 10 days  ED Provider: Army MeliaLaura Murphy, PA-C  Roderic ScarceErin N. Zigmund Danieleja, PharmD PGY2 Infectious Diseases Pharmacy Resident Phone: [phone] 08/03/2017, 9:40 AM

## 2017-09-03 ENCOUNTER — Emergency Department (HOSPITAL_COMMUNITY)
Admission: EM | Admit: 2017-09-03 | Discharge: 2017-09-04 | Payer: Self-pay | Attending: Emergency Medicine | Admitting: Emergency Medicine

## 2017-09-03 ENCOUNTER — Encounter (HOSPITAL_COMMUNITY): Payer: Self-pay

## 2017-09-03 ENCOUNTER — Other Ambulatory Visit: Payer: Self-pay

## 2017-09-03 DIAGNOSIS — F1110 Opioid abuse, uncomplicated: Secondary | ICD-10-CM | POA: Insufficient documentation

## 2017-09-03 DIAGNOSIS — F1910 Other psychoactive substance abuse, uncomplicated: Secondary | ICD-10-CM | POA: Insufficient documentation

## 2017-09-03 DIAGNOSIS — F1410 Cocaine abuse, uncomplicated: Secondary | ICD-10-CM | POA: Insufficient documentation

## 2017-09-03 DIAGNOSIS — N3000 Acute cystitis without hematuria: Secondary | ICD-10-CM | POA: Insufficient documentation

## 2017-09-03 DIAGNOSIS — J45909 Unspecified asthma, uncomplicated: Secondary | ICD-10-CM | POA: Insufficient documentation

## 2017-09-03 DIAGNOSIS — F419 Anxiety disorder, unspecified: Secondary | ICD-10-CM | POA: Insufficient documentation

## 2017-09-03 DIAGNOSIS — F319 Bipolar disorder, unspecified: Secondary | ICD-10-CM | POA: Insufficient documentation

## 2017-09-03 DIAGNOSIS — F17210 Nicotine dependence, cigarettes, uncomplicated: Secondary | ICD-10-CM | POA: Insufficient documentation

## 2017-09-03 DIAGNOSIS — Z79899 Other long term (current) drug therapy: Secondary | ICD-10-CM | POA: Insufficient documentation

## 2017-09-03 DIAGNOSIS — T424X1A Poisoning by benzodiazepines, accidental (unintentional), initial encounter: Secondary | ICD-10-CM | POA: Insufficient documentation

## 2017-09-03 DIAGNOSIS — T50901A Poisoning by unspecified drugs, medicaments and biological substances, accidental (unintentional), initial encounter: Secondary | ICD-10-CM

## 2017-09-03 LAB — CBC WITH DIFFERENTIAL/PLATELET
BASOS ABS: 0 10*3/uL (ref 0.0–0.1)
BASOS PCT: 1 %
EOS PCT: 2 %
Eosinophils Absolute: 0.1 10*3/uL (ref 0.0–0.7)
HCT: 36.8 % (ref 36.0–46.0)
Hemoglobin: 12.1 g/dL (ref 12.0–15.0)
Lymphocytes Relative: 34 %
Lymphs Abs: 1.7 10*3/uL (ref 0.7–4.0)
MCH: 26.2 pg (ref 26.0–34.0)
MCHC: 32.9 g/dL (ref 30.0–36.0)
MCV: 79.8 fL (ref 78.0–100.0)
MONO ABS: 0.6 10*3/uL (ref 0.1–1.0)
Monocytes Relative: 11 %
Neutro Abs: 2.7 10*3/uL (ref 1.7–7.7)
Neutrophils Relative %: 52 %
PLATELETS: 292 10*3/uL (ref 150–400)
RBC: 4.61 MIL/uL (ref 3.87–5.11)
RDW: 16.3 % — ABNORMAL HIGH (ref 11.5–15.5)
WBC: 5.2 10*3/uL (ref 4.0–10.5)

## 2017-09-03 LAB — COMPREHENSIVE METABOLIC PANEL
ALBUMIN: 3.7 g/dL (ref 3.5–5.0)
ALK PHOS: 72 U/L (ref 38–126)
ALT: 21 U/L (ref 0–44)
ANION GAP: 9 (ref 5–15)
AST: 27 U/L (ref 15–41)
BILIRUBIN TOTAL: 0.4 mg/dL (ref 0.3–1.2)
BUN: 16 mg/dL (ref 6–20)
CALCIUM: 9.2 mg/dL (ref 8.9–10.3)
CO2: 28 mmol/L (ref 22–32)
Chloride: 104 mmol/L (ref 98–111)
Creatinine, Ser: 0.9 mg/dL (ref 0.44–1.00)
GFR calc Af Amer: >60 mL/min (ref >60)
GLUCOSE: 102 mg/dL — AB (ref 70–99)
Potassium: 3.9 mmol/L (ref 3.5–5.1)
Sodium: 141 mmol/L (ref 135–145)
TOTAL PROTEIN: 7.8 g/dL (ref 6.5–8.1)

## 2017-09-03 LAB — URINALYSIS, ROUTINE W REFLEX MICROSCOPIC
BILIRUBIN URINE: NEGATIVE
GLUCOSE, UA: NEGATIVE mg/dL
Ketones, ur: NEGATIVE mg/dL
NITRITE: POSITIVE — AB
PH: 7 (ref 5.0–8.0)
Protein, ur: 100 mg/dL — AB
RBC / HPF: >50 RBC/hpf — ABNORMAL HIGH (ref 0–5)
SPECIFIC GRAVITY, URINE: 1.01 (ref 1.005–1.030)
WBC, UA: >50 WBC/hpf — ABNORMAL HIGH (ref 0–5)

## 2017-09-03 LAB — RAPID URINE DRUG SCREEN, HOSP PERFORMED
Amphetamines: POSITIVE — AB
BARBITURATES: NOT DETECTED
Benzodiazepines: POSITIVE — AB
COCAINE: POSITIVE — AB
Opiates: POSITIVE — AB
TETRAHYDROCANNABINOL: POSITIVE — AB

## 2017-09-03 MED ORDER — SODIUM CHLORIDE 0.9 % IV BOLUS
500.0000 mL | Freq: Once | INTRAVENOUS | Status: AC
  Administered 2017-09-03: 500 mL via INTRAVENOUS

## 2017-09-03 MED ORDER — SODIUM CHLORIDE 0.9 % IV SOLN
INTRAVENOUS | Status: DC
  Administered 2017-09-03: 17:00:00 via INTRAVENOUS

## 2017-09-03 NOTE — ED Provider Notes (Signed)
[facility]-EMERGENCY DEPT Provider Note   CSN: [id number] Arrival date & time: 09/03/17  1654     History   Chief Complaint Chief Complaint  Patient presents with  . Drug Overdose    HPI Eileen Torres is a 32 y.o. female.  HPI   She presents for evaluation of possible drug overdose.  She was at a motel when people with her called EMS.  She was found sleepy but responsive, was treated with Narcan, 0.5 mg, x1 without change in mental status.  She reportedly had blue material in her mouth thought to be Xanax which she had admitted to taking earlier today.  She admits to using heroin today, as well.  She denies chest pain, shortness of breath, weakness or dizziness.  There are no other known modifying factors.  Past Medical History:  Diagnosis Date  . AKI (acute kidney injury) (HCC) 06/2016  . Anxiety   . Bipolar disorder (HCC)   . Childhood asthma   . Depression   . Encounter for Essure implantation 09/2011  . GERD (gastroesophageal reflux disease)   . Heroin abuse (HCC)   . HSV (herpes simplex virus) infection 2009   ?outbreak could be HSV last pregnancy-not definate diagnosis per pt and chart- pt on Valtrex prophylacttcally  . Migraines    08/19/2015 "4-6 good ones/year"  . Polysubstance abuse (HCC)   . Prolonged QT interval   . Pyelonephritis     Patient Active Problem List   Diagnosis Date Noted  . Dental infection 02/12/2017  . Acute renal failure (HCC) 06/06/2016  . Alcohol abuse   . Dehydration   . Heroin withdrawal (HCC)   . Polysubstance (including opioids) dependence with physiological dependence (HCC)   . AKI (acute kidney injury) (HCC)   . Cocaine abuse (HCC) 08/16/2015  . Pyelonephritis   . Sepsis (HCC)   . 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 DILITATION & CURRETTAGE/HYSTROSCOPY WITH ESSURE  2013     OB History    Gravida  2   Para  2   Term  1   Preterm  1   AB      Living  2     SAB      TAB      Ectopic      Multiple      Live Births  1            Home Medications    Prior to Admission medications   Medication Sig Start Date End Date Taking? Authorizing Provider  acetaminophen (TYLENOL) 325 MG tablet Take 650 mg by mouth 3 (three) times daily as needed for moderate pain.    [provider]  amantadine (SYMMETREL) 100 MG capsule Take 100 mg by mouth 3 (three) times daily. 03/15/17   [provider]  carbamazepine (TEGRETOL) 200 MG tablet Take 200 mg by mouth 3 (three) times daily. 04/22/16   [provider]  cloNIDine (CATAPRES - DOSED IN MG/24 HR) 0.1 mg/24hr patch Place 0.1 mg onto the skin once a week.    [provider]  gabapentin (NEURONTIN) 400 MG capsule Take 400 mg by mouth 2 (two) times daily. 04/10/17   [provider]  ondansetron (ZOFRAN) 4 MG tablet Take 1 tablet (4 mg total) by mouth every 6 (six) hours. 07/31/17   Eyvonne Mechanic, PA-C    Family History Family History  Problem Relation Age of Onset  . Cancer  Other   . Diabetes Other   . Heart failure Other   . Stroke Other     Social History Social History   Tobacco Use  . Smoking status: Current Every Day Smoker    Packs/day: 0.25    Years: 19.00    Pack years: 4.75    Types: Cigarettes  . Smokeless tobacco: Never Used  Substance Use Topics  . Alcohol use: Yes  . Drug use: Yes    Comment: roxicodone, heroin, cocaine     Allergies   Patient has no known allergies.   Review of Systems Review of Systems  All other systems reviewed and are negative.    Physical Exam Updated Vital Signs BP 107/74   Pulse 91   Temp 98.8 F (37.1 C) (Rectal)   Resp 14   Ht 5\' 3"  (1.6 m)   Wt 54 kg   SpO2 99%   BMI 21.09 kg/m   Physical Exam  Constitutional: She is oriented to person, place, and time. She appears well-developed.  She appears older than stated age.  HENT:  Head: Normocephalic and atraumatic.    Eyes: Pupils are equal, round, and reactive to light. Conjunctivae and EOM are normal.  Neck: Normal range of motion and phonation normal. Neck supple.  Cardiovascular: Normal rate and regular rhythm.  Pulmonary/Chest: Effort normal and breath sounds normal. She exhibits no tenderness.  Abdominal: Soft. She exhibits no distension. There is no tenderness. There is no guarding.  Musculoskeletal: Normal range of motion.  Neurological: She is alert and oriented to person, place, and time. No cranial nerve deficit. She exhibits normal muscle tone. Coordination normal.  Dysarthria is present.  No aphasia.    Skin: Skin is warm and dry.  Psychiatric: She has a normal mood and affect. Her behavior is normal.  Nursing note and vitals reviewed.    ED Treatments / Results  Labs (all labs ordered are listed, but only abnormal results are displayed) Labs Reviewed  COMPREHENSIVE METABOLIC PANEL - Abnormal; Notable for the following components:      Result Value   Glucose, Bld 102 (*)    All other components within normal limits  CBC WITH DIFFERENTIAL/PLATELET - Abnormal; Notable for the following components:   RDW 16.3 (*)    All other components within normal limits  URINALYSIS, ROUTINE W REFLEX MICROSCOPIC - Abnormal; Notable for the following components:   APPearance TURBID (*)    Hgb urine dipstick SMALL (*)    Protein, ur 100 (*)    Nitrite POSITIVE (*)    Leukocytes, UA LARGE (*)    RBC / HPF >50 (*)    WBC, UA >50 (*)    Bacteria, UA MANY (*)    All other components within normal limits  RAPID URINE DRUG SCREEN, HOSP PERFORMED - Abnormal; Notable for the following components:   Opiates POSITIVE (*)    Cocaine POSITIVE (*)    Benzodiazepines POSITIVE (*)    Amphetamines POSITIVE (*)    Tetrahydrocannabinol POSITIVE (*)    All other components within normal limits    EKG EKG Interpretation  Date/Time:  Sunday September 03 2017 17:13:55 EDT Ventricular Rate:  94 PR  Interval:    QRS Duration: 89 QT Interval:  369 QTC Calculation: 462 R Axis:   84 Text Interpretation:  Sinus rhythm since last tracing no significant change Confirmed by Mancel Bale [phone]) on 09/03/2017 8:19:57 PM   Radiology No results found.  Procedures .Critical Care Performed by: Mancel Bale,  MD Authorized by: Mancel Bale, MD   Critical care provider statement:    Critical care time (minutes):  35   Critical care start time:  09/03/2017 5:00 PM   Critical care end time:  09/03/2017 11:19 PM   Critical care time was exclusive of:  Separately billable procedures and treating other patients   Critical care was necessary to treat or prevent imminent or life-threatening deterioration of the following conditions:  CNS failure or compromise   Critical care was time spent personally by me on the following activities:  Blood draw for specimens, development of treatment plan with patient or surrogate, discussions with consultants, evaluation of patient's response to treatment, examination of patient, obtaining history from patient or surrogate, ordering and performing treatments and interventions, ordering and review of laboratory studies, pulse oximetry, re-evaluation of patient's condition, review of old charts and ordering and review of radiographic studies   (including critical care time)  Medications Ordered in ED Medications  0.9 %  sodium chloride infusion ( Intravenous New Bag/Given 09/03/17 1723)  sodium chloride 0.9 % bolus 500 mL (0 mLs Intravenous Stopped 09/03/17 1839)     Initial Impression / Assessment and Plan / ED Course  I have reviewed the triage vital signs and the nursing notes.  Pertinent labs & imaging results that were available during my care of the patient were reviewed by me and considered in my medical decision making (see chart for details).  Clinical Course as of Sep 03 2317  Wynelle Link Sep 03, 2017  2056 At this time the patient is sleeping.  She arouses  easily to voice and answers questions.  I asked her if she remembers why she was here, and she stated "sadly."  She then went back to sleep.   [EW]    Clinical Course User Index [EW] Mancel Bale, MD     Patient Vitals for the past 24 hrs:  BP Temp Temp src Pulse Resp SpO2 Height Weight  09/03/17 2133 107/74 - - 91 14 99 % - -  09/03/17 2100 102/66 - - 95 16 97 % - -  09/03/17 2030 108/77 - - - - 96 % - -  09/03/17 2000 103/63 - - 99 16 95 % - -  09/03/17 1930 102/62 - - 100 15 94 % - -  09/03/17 1900 130/84 - - (!) 117 17 95 % - -  09/03/17 1830 106/74 - - (!) 101 16 96 % - -  09/03/17 1800 95/66 - - 93 15 97 % - -  09/03/17 1741 99/64 - - 94 11 98 % - -  09/03/17 1712 - 98.8 F (37.1 C) Rectal 95 - - - -  09/03/17 1708 - - - - - - 5\' 3"  (1.6 m) 54 kg  09/03/17 1658 - - - - - 99 % - -    11:17 PM Reevaluation with update and discussion. After initial assessment and treatment, an updated evaluation reveals patient continues to be arousable but lethargic.  She will require additional observation until sober. Mancel Bale   Medical Decision Making: Polysubstance abuse, with intoxication.  Doubt suicide attempt or gesture.  Patient intoxicated requiring observation in the emergency department.  Doubt serious bacterial infection, metabolic instability, toxidrome or impending vascular collapse  CRITICAL CARE-yes Performed by: Mancel Bale   Nursing Notes Reviewed/ Care Coordinated Applicable Imaging Reviewed Interpretation of Laboratory Data incorporated into ED treatment   Plan-care to oncoming provider team to evaluate, until sober    Final  Clinical Impressions(s) / ED Diagnoses   Final diagnoses:  Polysubstance abuse [facility])    ED Discharge Orders    None       Mancel Bale, MD 09/03/17 2319

## 2017-09-03 NOTE — ED Notes (Signed)
Attempted to ambulate pt to restroom with staff assistance, unsteady gait. Unable to urinate at this time.

## 2017-09-03 NOTE — ED Notes (Signed)
[employer] at door entrance to pt room.

## 2017-09-03 NOTE — ED Notes (Addendum)
Notified that pt states she need to urinate, when the tech at bedside attempted to get the bedpan she stated she could not hold it and urinated in the floor.

## 2017-09-03 NOTE — ED Notes (Signed)
RN will inform off duty [employer] officer to call Dispatch to send [employer] unit to pick patient up for arrest and transport to jail. RN will inform Public relations account executive.

## 2017-09-03 NOTE — ED Triage Notes (Signed)
Pt arrived via [facility] with [employer] escort. Pt is Alert to self and has GCS of 10. Some guy who bought hotel for patient called 911 due to patient appearing to have done some substance. Pt stated she injected about 1 gram of heroin, unknown amount of gabapentin and xanax.   Pt is in police custody with [employer]. Patient does have warrants for arrest.

## 2017-09-04 MED ORDER — CIPROFLOXACIN HCL 500 MG PO TABS: 500.0000 mg | ORAL_TABLET | Freq: Two times a day (BID) | ORAL | 0 refills | Status: AC

## 2017-09-04 MED ORDER — BACITRACIN ZINC 500 UNIT/GM EX OINT
TOPICAL_OINTMENT | CUTANEOUS | Status: DC | PRN
  Administered 2017-09-04: 1 via TOPICAL

## 2017-09-04 MED ORDER — BACITRACIN ZINC 500 UNIT/GM EX OINT
TOPICAL_OINTMENT | CUTANEOUS | Status: AC
  Administered 2017-09-04: 1 via TOPICAL
  Filled 2017-09-04: qty 0.9

## 2017-09-04 MED ORDER — CIPROFLOXACIN HCL 500 MG PO TABS
500.0000 mg | ORAL_TABLET | Freq: Once | ORAL | Status: AC
  Administered 2017-09-04: 500 mg via ORAL
  Filled 2017-09-04: qty 1

## 2017-09-04 NOTE — Discharge Instructions (Addendum)
Cipro as prescribed.  All up with primary doctor as needed.

## 2017-09-04 NOTE — ED Notes (Signed)
Pt refused to sign discharge 

## 2017-09-04 NOTE — ED Provider Notes (Signed)
Care assumed from Dr. Effie Shy at shift change.  Patient brought here after apparent drug overdose.  She has remained in the department through the night.  She has rested comfortably with no incident.  She is now awake, alert, and easily arousable.  Her laboratory studies are unremarkable with the exception of evidence for a UTI.  She will be given Cipro and discharged.  It is my understanding that she has outstanding warrants and will be leaving in the custody of the authorities.   Geoffery Lyons, MD 09/04/17 0600

## 2017-10-03 ENCOUNTER — Telehealth: Payer: Self-pay | Admitting: Student in an Organized Health Care Education/Training Program

## 2017-10-03 NOTE — Telephone Encounter (Signed)
Left pt voicemail asking for them to call back to schedule an appointment for health maintenance check. CH

## 2018-03-04 DEATH — deceased

## 2021-06-08 ENCOUNTER — Encounter: Payer: Self-pay | Admitting: *Deleted
